# Patient Record
Sex: Male | Born: 1985 | Race: White | Hispanic: No | Marital: Single | Smoking: Never smoker
Health system: Southern US, Community
[De-identification: ages and names within clinical notes are randomized; demographics above are authoritative.]

## PROBLEM LIST (undated history)

## (undated) DIAGNOSIS — J4540 Moderate persistent asthma, uncomplicated: Secondary | ICD-10-CM

## (undated) DIAGNOSIS — G4700 Insomnia, unspecified: Secondary | ICD-10-CM

## (undated) DIAGNOSIS — J45909 Unspecified asthma, uncomplicated: Secondary | ICD-10-CM

## (undated) DIAGNOSIS — R030 Elevated blood-pressure reading, without diagnosis of hypertension: Secondary | ICD-10-CM

## (undated) DIAGNOSIS — M25519 Pain in unspecified shoulder: Secondary | ICD-10-CM

## (undated) HISTORY — DX: Elevated blood-pressure reading, without diagnosis of hypertension: R03.0

## (undated) HISTORY — DX: Pain in unspecified shoulder: M25.519

## (undated) HISTORY — DX: Moderate persistent asthma, uncomplicated: J45.40

## (undated) HISTORY — DX: Insomnia, unspecified: G47.00

## (undated) HISTORY — DX: Morbid (severe) obesity due to excess calories: E66.01

---

## 2006-03-13 ENCOUNTER — Emergency Department: Payer: Self-pay | Admitting: Unknown Physician Specialty

## 2006-03-26 ENCOUNTER — Emergency Department: Payer: Self-pay | Admitting: Emergency Medicine

## 2008-04-20 ENCOUNTER — Emergency Department: Payer: Self-pay | Admitting: Emergency Medicine

## 2010-04-12 ENCOUNTER — Emergency Department: Payer: Self-pay | Admitting: Emergency Medicine

## 2013-05-03 ENCOUNTER — Ambulatory Visit: Payer: Self-pay

## 2013-12-06 ENCOUNTER — Emergency Department: Payer: Self-pay | Admitting: Emergency Medicine

## 2015-02-13 DIAGNOSIS — G4700 Insomnia, unspecified: Secondary | ICD-10-CM

## 2015-02-13 DIAGNOSIS — M25519 Pain in unspecified shoulder: Secondary | ICD-10-CM

## 2015-02-13 DIAGNOSIS — J4540 Moderate persistent asthma, uncomplicated: Secondary | ICD-10-CM

## 2015-02-13 DIAGNOSIS — R030 Elevated blood-pressure reading, without diagnosis of hypertension: Secondary | ICD-10-CM

## 2015-02-13 HISTORY — DX: Elevated blood-pressure reading, without diagnosis of hypertension: R03.0

## 2015-02-13 HISTORY — DX: Insomnia, unspecified: G47.00

## 2015-02-13 HISTORY — DX: Moderate persistent asthma, uncomplicated: J45.40

## 2015-02-13 HISTORY — DX: Pain in unspecified shoulder: M25.519

## 2015-02-13 HISTORY — DX: Morbid (severe) obesity due to excess calories: E66.01

## 2016-01-08 ENCOUNTER — Encounter: Payer: Self-pay | Admitting: Emergency Medicine

## 2016-01-08 ENCOUNTER — Emergency Department
Admission: EM | Admit: 2016-01-08 | Discharge: 2016-01-08 | Disposition: A | Discharge status: Home / Self Care | Payer: BLUE CROSS/BLUE SHIELD | Attending: Emergency Medicine | Admitting: Emergency Medicine

## 2016-01-08 ENCOUNTER — Emergency Department: Payer: BLUE CROSS/BLUE SHIELD

## 2016-01-08 DIAGNOSIS — M25512 Pain in left shoulder: Secondary | ICD-10-CM | POA: Diagnosis present

## 2016-01-08 DIAGNOSIS — J45909 Unspecified asthma, uncomplicated: Secondary | ICD-10-CM | POA: Insufficient documentation

## 2016-01-08 HISTORY — DX: Unspecified asthma, uncomplicated: J45.909

## 2016-01-08 MED ORDER — METHOCARBAMOL 500 MG PO TABS: 500.0000 mg | ORAL_TABLET | Freq: Four times a day (QID) | ORAL | 0 refills | Status: AC

## 2016-01-08 MED ORDER — HYDROMORPHONE HCL 1 MG/ML IJ SOLN
1.0000 mg | Freq: Once | INTRAMUSCULAR | Status: AC
  Administered 2016-01-08: 1 mg via INTRAMUSCULAR
  Filled 2016-01-08: qty 1

## 2016-01-08 NOTE — ED Provider Notes (Signed)
[facility] Emergency Department Provider Note  ____________________________________________  Time seen: Approximately 8:54 AM  I have reviewed the triage vital signs and the nursing notes.   HISTORY  Chief Complaint Shoulder Pain    HPI Ronnie Chandler is a 30 y.o. male who complains of left shoulder pain since last night. 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  He's had corrective surgery on the right shoulder but not on the left shoulder so far. He says that he often dislocates it while he sleeps and this occurred just a few days ago but he is able to reduce it by himself at home. Last night, he was not asleep, but he was awake lying in bed watching TV when he felt sudden pain in the left shoulder and was concerned that it was dislocated again. Since then the pain is been continuous and worse with shoulder movement. No chest pain shortness of breath fevers or chills. No recent trauma .     Past Medical History:  Diagnosis Date  . Asthma      There are no active problems to display for this patient.    History reviewed. No pertinent surgical history. Right shoulder surgery  Prior to Admission medications   Medication Sig Start Date End Date Taking? Authorizing Provider  methocarbamol (ROBAXIN) 500 MG tablet Take 1 tablet (500 mg total) by mouth 4 (four) times daily. 01/08/16   Carrie Mew, MD     Allergies Patient has no known allergies.   No family history on file.  Social History Social History  Substance Use Topics  . Smoking status: Never Smoker  . Smokeless tobacco: Never Used  . Alcohol use Yes    Review of Systems  Constitutional:   No fever or chills.  ENT:   No sore throat. No rhinorrhea. Cardiovascular:   No chest pain. Respiratory:   No dyspnea or cough. Gastrointestinal:   Negative for abdominal pain, vomiting and  diarrhea.  Genitourinary:   Negative for dysuria or difficulty urinating. Musculoskeletal:   Left shoulder pain as above Neurological:   Negative for headaches 10-point ROS otherwise negative.  ____________________________________________   PHYSICAL EXAM:  VITAL SIGNS: ED Triage Vitals  Enc Vitals Group     BP 01/08/16 0716 (!) 122/106     Pulse Rate 01/08/16 0716 (!) 150     Resp 01/08/16 0716 (!) 22     Temp 01/08/16 0716 98.7 F (37.1 C)     Temp Source 01/08/16 0716 Oral     SpO2 01/08/16 0716 98 %     Weight 01/08/16 0717 272 lb (123.4 kg)     Height 01/08/16 0717 5\' 11"  (1.803 m)     Head Circumference --      Peak Flow --      Pain Score 01/08/16 0717 8     Pain Loc --      Pain Edu? --      Excl. in [place]? --     Vital signs reviewed, nursing assessments reviewed.   Constitutional:   Alert and oriented. Well appearing and in no distress. Eyes:   No scleral icterus. No conjunctival pallor. PERRL. EOMI.  No nystagmus. ENT   Head:   Normocephalic and atraumatic.   Nose:   No congestion/rhinnorhea. No septal hematoma   Mouth/Throat:   MMM, no pharyngeal erythema. No peritonsillar mass.    Neck:   No  stridor. No SubQ emphysema. No meningismus. Hematological/Lymphatic/Immunilogical:   No cervical lymphadenopathy. Cardiovascular:   RRR. Symmetric bilateral radial and DP pulses.  No murmurs.  Respiratory:   Normal respiratory effort without tachypnea nor retractions. Breath sounds are clear and equal bilaterally. No wheezes/rales/rhonchi. Gastrointestinal:   Soft and nontender. Non distended. There is no CVA tenderness.  No rebound, rigidity, or guarding. Genitourinary:   deferred Musculoskeletal:   Mild tenderness diffusely over the entire track of the deltoid muscle around the left shoulder. No bony point tenderness. No severe pain with passive range of motion, but patient is guarding the shoulder quite a bit and there appears to be some muscular spasm as  well.. Neurologic:   Normal speech and language.  CN 2-10 normal. Motor grossly intact. No gross focal neurologic deficits are appreciated.  Skin:    Skin is warm, dry and intact. No rash noted.  No petechiae, purpura, or bullae. No ecchymosis or crepitus or inflammatory changes, particularly in the area of the left shoulder.  ____________________________________________    LABS (pertinent positives/negatives) (all labs ordered are listed, but only abnormal results are displayed) Labs Reviewed - No data to display ____________________________________________   EKG    ____________________________________________    RADIOLOGY  X-ray left shoulder unremarkable  ____________________________________________   PROCEDURES Procedures  ____________________________________________   INITIAL IMPRESSION / ASSESSMENT AND PLAN / ED COURSE  Pertinent labs & imaging results that were available during my care of the patient were reviewed by me and considered in my medical decision making (see chart for details).  Patient well appearing in no acute distress, except since unremarkable except for left shoulder pain with guarding by the patient. No evidence of fracture dislocation osteomyelitis septic arthritis or soft tissue infection. According the patient this is a chronic issue. He has a shoulder immobilizer at home but he has not been using it because it is difficult to apply by himself. He has not yet followed up with orthopedics because he had applied for disability insurance and was waiting for decision on that so that it could hopefully help pay for his further medical care. I have encouraged him to follow up with orthopedics as soon as possible. We'll give him a sling for joint support for now. Methocarbamol for muscle relaxation.   Clinical Course    ____________________________________________   FINAL CLINICAL IMPRESSION(S) / ED DIAGNOSES  Final diagnoses:  Left shoulder  pain, unspecified chronicity      New Prescriptions   METHOCARBAMOL (ROBAXIN) 500 MG TABLET    Take 1 tablet (500 mg total) by mouth 4 (four) times daily.     Portions of this note were generated with dragon dictation software. Dictation errors may occur despite best attempts at proofreading.    Carrie Mew, MD 01/08/16 0900

## 2016-01-08 NOTE — ED Notes (Signed)
Patient transported to X-ray 

## 2016-01-08 NOTE — ED Triage Notes (Signed)
Pt reports left shoulder pain, possible dislocation during the night while lying in bed. Pt states dislocated last week but pt was able to put back in.

## 2016-01-08 NOTE — ED Notes (Addendum)
Pt does not want sling. He wants to use immobilizer at house.

## 2016-01-12 HISTORY — PX: SHOULDER ARTHROSCOPY: SHX128

## 2016-01-23 ENCOUNTER — Other Ambulatory Visit: Payer: Self-pay | Admitting: Student

## 2016-01-23 DIAGNOSIS — S43005S Unspecified dislocation of left shoulder joint, sequela: Secondary | ICD-10-CM

## 2016-01-23 DIAGNOSIS — M25312 Other instability, left shoulder: Secondary | ICD-10-CM

## 2016-01-30 ENCOUNTER — Other Ambulatory Visit: Payer: Self-pay | Admitting: Surgery

## 2016-01-30 DIAGNOSIS — S41009A Unspecified open wound of unspecified shoulder, initial encounter: Secondary | ICD-10-CM

## 2016-01-30 DIAGNOSIS — S43006A Unspecified dislocation of unspecified shoulder joint, initial encounter: Principal | ICD-10-CM

## 2016-02-05 ENCOUNTER — Inpatient Hospital Stay: Admission: RE | Admit: 2016-02-05 | Payer: BLUE CROSS/BLUE SHIELD | Source: Ambulatory Visit

## 2016-02-05 ENCOUNTER — Ambulatory Visit: Admission: RE | Admit: 2016-02-05 | Payer: BLUE CROSS/BLUE SHIELD | Source: Ambulatory Visit

## 2016-02-05 ENCOUNTER — Ambulatory Visit: Payer: BLUE CROSS/BLUE SHIELD

## 2016-02-05 ENCOUNTER — Other Ambulatory Visit: Payer: Self-pay | Admitting: Surgery

## 2016-02-05 DIAGNOSIS — M25312 Other instability, left shoulder: Secondary | ICD-10-CM

## 2016-02-05 DIAGNOSIS — S43006A Unspecified dislocation of unspecified shoulder joint, initial encounter: Principal | ICD-10-CM

## 2016-02-05 DIAGNOSIS — S41009A Unspecified open wound of unspecified shoulder, initial encounter: Secondary | ICD-10-CM

## 2016-02-11 ENCOUNTER — Ambulatory Visit
Admission: RE | Admit: 2016-02-11 | Discharge: 2016-02-11 | Disposition: A | Discharge status: Home / Self Care | Payer: BLUE CROSS/BLUE SHIELD | Source: Ambulatory Visit | Attending: Surgery | Admitting: Surgery

## 2016-02-11 DIAGNOSIS — S43006A Unspecified dislocation of unspecified shoulder joint, initial encounter: Principal | ICD-10-CM

## 2016-02-11 DIAGNOSIS — M25312 Other instability, left shoulder: Secondary | ICD-10-CM

## 2016-02-11 DIAGNOSIS — S41009A Unspecified open wound of unspecified shoulder, initial encounter: Secondary | ICD-10-CM

## 2016-02-11 MED ORDER — IOPAMIDOL (ISOVUE-M 200) INJECTION 41%
12.0000 mL | Freq: Once | INTRAMUSCULAR | Status: AC
  Administered 2016-02-11: 12 mL via INTRA_ARTICULAR

## 2017-08-16 ENCOUNTER — Ambulatory Visit: Payer: BLUE CROSS/BLUE SHIELD | Admitting: Surgery

## 2017-08-16 ENCOUNTER — Encounter: Payer: Self-pay | Admitting: Surgery

## 2017-08-16 VITALS — BP 123/79 | HR 83 | Temp 98.7°F | Ht 71.0 in | Wt 273.8 lb

## 2017-08-16 DIAGNOSIS — K429 Umbilical hernia without obstruction or gangrene: Secondary | ICD-10-CM
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
## 2017-08-16 NOTE — Patient Instructions (Signed)
You have requested for your Umbilical Hernia be repaired. This has been scheduled on 08/22/2017 by Dr. Dr. Tama High at [facility].  Please see your (blue)pre-care sheet for information.  You will need to arrange to be off work for 1-2 weeks but will have to have a lifting restriction of no more than 15 lbs for 6 weeks following your surgery.   Umbilical Hernia, Adult A hernia is a bulge of tissue that pushes through an opening between muscles. An umbilical hernia happens in the abdomen, near the belly button (umbilicus). The hernia may contain tissues from the small intestine, large intestine, or fatty tissue covering the intestines (omentum). Umbilical hernias in adults tend to get worse over time, and they require surgical treatment. There are several types of umbilical hernias. You may have:  A hernia located just above or below the umbilicus (indirect hernia). This is the most common type of umbilical hernia in adults.  A hernia that forms through an opening formed by the umbilicus (direct hernia).  A hernia that comes and goes (reducible hernia). A reducible hernia may be visible only when you strain, lift something heavy, or cough. This type of hernia can be pushed back into the abdomen (reduced).  A hernia that traps abdominal tissue inside the hernia (incarcerated hernia). This type of hernia cannot be reduced.  A hernia that cuts off blood flow to the tissues inside the hernia (strangulated hernia). The tissues can start to die if this happens. This type of hernia requires emergency treatment.  What are the causes? An umbilical hernia happens when tissue inside the abdomen presses on a weak area of the abdominal muscles. What increases the risk? You may have a greater risk of this condition if you:  Are obese.  Have had several pregnancies.  Have a buildup of fluid inside your abdomen (ascites).  Have had surgery that weakens the abdominal muscles.  What are the  signs or symptoms? The main symptom of this condition is a painless bulge at or near the belly button. A reducible hernia may be visible only when you strain, lift something heavy, or cough. Other symptoms may include:  Dull pain.  A feeling of pressure.  Symptoms of a strangulated hernia may include:  Pain that gets increasingly worse.  Nausea and vomiting.  Pain when pressing on the hernia.  Skin over the hernia becoming red or purple.  Constipation.  Blood in the stool.  How is this diagnosed? This condition may be diagnosed based on:  A physical exam. You may be asked to cough or strain while standing. These actions increase the pressure inside your abdomen and force the hernia through the opening in your muscles. Your health care provider may try to reduce the hernia by pressing on it.  Your symptoms and medical history.  How is this treated? Surgery is the only treatment for an umbilical hernia. Surgery for a strangulated hernia is done as soon as possible. If you have a small hernia that is not incarcerated, you may need to lose weight before having surgery. Follow these instructions at home:  Lose weight, if told by your health care provider.  Do not try to push the hernia back in.  Watch your hernia for any changes in color or size. Tell your health care provider if any changes occur.  You may need to avoid activities that increase pressure on your hernia.  Do not lift anything that is heavier than 10 lb (4.5 kg) until your  health care provider says that this is safe.  Take over-the-counter and prescription medicines only as told by your health care provider.  Keep all follow-up visits as told by your health care provider. This is important. Contact a health care provider if:  Your hernia gets larger.  Your hernia becomes painful. Get help right away if:  You develop sudden, severe pain near the area of your hernia.  You have pain as well as nausea or  vomiting.  You have pain and the skin over your hernia changes color.  You develop a fever. This information is not intended to replace advice given to you by your health care provider. Make sure you discuss any questions you have with your health care provider. Document Released: 05/30/2015 Document Revised: 08/31/2015 Document Reviewed: 05/30/2015 Elsevier Interactive Patient Education  [employer].

## 2017-08-18 ENCOUNTER — Telehealth: Payer: Self-pay | Admitting: Surgery

## 2017-08-18 NOTE — Telephone Encounter (Signed)
Pt advised of pre op date/time and sx date. Sx: 08/22/17 with Dr Marcy Siren umbilical hernia repair with mesh.  Pre op: Patient advised to arrive at 8:00am the day of surgery-[facility] registration desk. Also NPO after midnight.

## 2017-08-21 MED ORDER — DEXTROSE 5 % IV SOLN
3.0000 g | INTRAVENOUS | Status: AC
  Administered 2017-08-22: 3 g via INTRAVENOUS
  Filled 2017-08-21: qty 3

## 2017-08-22 ENCOUNTER — Ambulatory Visit: Payer: BLUE CROSS/BLUE SHIELD | Admitting: Certified Registered"

## 2017-08-22 ENCOUNTER — Encounter: Payer: Self-pay | Admitting: *Deleted

## 2017-08-22 ENCOUNTER — Ambulatory Visit
Admission: RE | Admit: 2017-08-22 | Discharge: 2017-08-22 | Disposition: A | Discharge status: Home / Self Care | Payer: BLUE CROSS/BLUE SHIELD | Source: Ambulatory Visit | Attending: Surgery | Admitting: Surgery

## 2017-08-22 ENCOUNTER — Encounter
Admission: RE | Disposition: A | Discharge status: Home / Self Care | Payer: Self-pay | Source: Ambulatory Visit | Attending: Surgery

## 2017-08-22 DIAGNOSIS — G8929 Other chronic pain: Secondary | ICD-10-CM | POA: Diagnosis not present

## 2017-08-22 DIAGNOSIS — Z79899 Other long term (current) drug therapy: Secondary | ICD-10-CM | POA: Diagnosis not present

## 2017-08-22 DIAGNOSIS — I10 Essential (primary) hypertension: Secondary | ICD-10-CM | POA: Diagnosis not present

## 2017-08-22 DIAGNOSIS — Z6838 Body mass index (BMI) 38.0-38.9, adult: Secondary | ICD-10-CM | POA: Diagnosis not present

## 2017-08-22 DIAGNOSIS — M545 Low back pain: Secondary | ICD-10-CM | POA: Diagnosis not present

## 2017-08-22 DIAGNOSIS — G4700 Insomnia, unspecified: Secondary | ICD-10-CM | POA: Diagnosis not present

## 2017-08-22 DIAGNOSIS — Z791 Long term (current) use of non-steroidal anti-inflammatories (NSAID): Secondary | ICD-10-CM | POA: Insufficient documentation

## 2017-08-22 DIAGNOSIS — J4540 Moderate persistent asthma, uncomplicated: Secondary | ICD-10-CM | POA: Insufficient documentation

## 2017-08-22 DIAGNOSIS — K429 Umbilical hernia without obstruction or gangrene: Secondary | ICD-10-CM | POA: Diagnosis present

## 2017-08-22 DIAGNOSIS — M25519 Pain in unspecified shoulder: Secondary | ICD-10-CM | POA: Insufficient documentation

## 2017-08-22 HISTORY — PX: UMBILICAL HERNIA REPAIR: SHX196

## 2017-08-22 LAB — URINE DRUG SCREEN, QUALITATIVE (ARMC ONLY)
Amphetamines, Ur Screen: NOT DETECTED
BARBITURATES, UR SCREEN: NOT DETECTED
CANNABINOID 50 NG, UR ~~LOC~~: POSITIVE — AB
Cocaine Metabolite,Ur ~~LOC~~: NOT DETECTED
MDMA (ECSTASY) UR SCREEN: NOT DETECTED
METHADONE SCREEN, URINE: NOT DETECTED
Opiate, Ur Screen: NOT DETECTED
Phencyclidine (PCP) Ur S: NOT DETECTED
TRICYCLIC, UR SCREEN: NOT DETECTED

## 2017-08-22 LAB — BASIC METABOLIC PANEL
Anion gap: 10 (ref 5–15)
BUN: 8 mg/dL (ref 6–20)
CALCIUM: 9.1 mg/dL (ref 8.9–10.3)
CO2: 25 mmol/L (ref 22–32)
CREATININE: 0.87 mg/dL (ref 0.61–1.24)
Chloride: 105 mmol/L (ref 98–111)
GFR calc Af Amer: >60 mL/min (ref >60)
Glucose, Bld: 118 mg/dL — ABNORMAL HIGH (ref 70–99)
Potassium: 3.8 mmol/L (ref 3.5–5.1)
SODIUM: 140 mmol/L (ref 135–145)

## 2017-08-22 LAB — CBC WITH DIFFERENTIAL/PLATELET
BASOS ABS: 0.1 10*3/uL (ref 0–0.1)
BASOS PCT: 1 %
EOS ABS: 0.6 10*3/uL (ref 0–0.7)
EOS PCT: 9 %
HCT: 42.4 % (ref 40.0–52.0)
HEMOGLOBIN: 14.5 g/dL (ref 13.0–18.0)
LYMPHS ABS: 1.5 10*3/uL (ref 1.0–3.6)
Lymphocytes Relative: 23 %
MCH: 32.3 pg (ref 26.0–34.0)
MCHC: 34.2 g/dL (ref 32.0–36.0)
MCV: 94.4 fL (ref 80.0–100.0)
Monocytes Absolute: 0.5 10*3/uL (ref 0.2–1.0)
Monocytes Relative: 8 %
Neutro Abs: 4 10*3/uL (ref 1.4–6.5)
Neutrophils Relative %: 59 %
PLATELETS: 388 10*3/uL (ref 150–440)
RBC: 4.49 MIL/uL (ref 4.40–5.90)
RDW: 13.5 % (ref 11.5–14.5)
WBC: 6.7 10*3/uL (ref 3.8–10.6)

## 2017-08-22 SURGERY — REPAIR, HERNIA, UMBILICAL, ADULT
Anesthesia: General

## 2017-08-22 MED ORDER — BUPIVACAINE HCL (PF) 0.5 % IJ SOLN
INTRAMUSCULAR | Status: AC
  Filled 2017-08-22: qty 30

## 2017-08-22 MED ORDER — IPRATROPIUM-ALBUTEROL 0.5-2.5 (3) MG/3ML IN SOLN
3.0000 mL | Freq: Once | RESPIRATORY_TRACT | Status: AC
  Administered 2017-08-22: 3 mL via RESPIRATORY_TRACT

## 2017-08-22 MED ORDER — DEXAMETHASONE SODIUM PHOSPHATE 10 MG/ML IJ SOLN
INTRAMUSCULAR | Status: DC | PRN
  Administered 2017-08-22: 8 mg via INTRAVENOUS

## 2017-08-22 MED ORDER — ACETAMINOPHEN 500 MG PO TABS
1000.0000 mg | ORAL_TABLET | ORAL | Status: AC
  Administered 2017-08-22: 1000 mg via ORAL

## 2017-08-22 MED ORDER — FENTANYL CITRATE (PF) 100 MCG/2ML IJ SOLN
INTRAMUSCULAR | Status: AC
  Administered 2017-08-22: 25 ug via INTRAVENOUS
  Filled 2017-08-22: qty 2

## 2017-08-22 MED ORDER — PROPOFOL 10 MG/ML IV BOLUS
INTRAVENOUS | Status: AC
  Filled 2017-08-22: qty 20

## 2017-08-22 MED ORDER — MIDAZOLAM HCL 2 MG/2ML IJ SOLN
INTRAMUSCULAR | Status: DC | PRN
  Administered 2017-08-22: 2 mg via INTRAVENOUS

## 2017-08-22 MED ORDER — ALBUTEROL SULFATE HFA 108 (90 BASE) MCG/ACT IN AERS
INHALATION_SPRAY | RESPIRATORY_TRACT | Status: AC
  Filled 2017-08-22: qty 6.7

## 2017-08-22 MED ORDER — PROPOFOL 10 MG/ML IV BOLUS
INTRAVENOUS | Status: DC | PRN
  Administered 2017-08-22: 250 mg via INTRAVENOUS
  Administered 2017-08-22 (×2): 50 mg via INTRAVENOUS

## 2017-08-22 MED ORDER — OXYCODONE HCL 5 MG PO TABS
ORAL_TABLET | ORAL | Status: AC
  Administered 2017-08-22: 5 mg via ORAL
  Filled 2017-08-22: qty 1

## 2017-08-22 MED ORDER — LIDOCAINE HCL 1 % IJ SOLN
INTRAMUSCULAR | Status: DC | PRN
  Administered 2017-08-22: 20 mL

## 2017-08-22 MED ORDER — LACTATED RINGERS IV SOLN
INTRAVENOUS | Status: DC
  Administered 2017-08-22: 09:00:00 via INTRAVENOUS

## 2017-08-22 MED ORDER — SUCCINYLCHOLINE CHLORIDE 20 MG/ML IJ SOLN
INTRAMUSCULAR | Status: DC | PRN
  Administered 2017-08-22: 120 mg via INTRAVENOUS

## 2017-08-22 MED ORDER — FENTANYL CITRATE (PF) 100 MCG/2ML IJ SOLN
25.0000 ug | INTRAMUSCULAR | Status: DC | PRN
  Administered 2017-08-22: 25 ug via INTRAVENOUS
  Administered 2017-08-22 (×2): 50 ug via INTRAVENOUS
  Administered 2017-08-22: 25 ug via INTRAVENOUS

## 2017-08-22 MED ORDER — GABAPENTIN 300 MG PO CAPS
300.0000 mg | ORAL_CAPSULE | ORAL | Status: AC
  Administered 2017-08-22: 300 mg via ORAL

## 2017-08-22 MED ORDER — GABAPENTIN 300 MG PO CAPS
ORAL_CAPSULE | ORAL | Status: AC
  Administered 2017-08-22: 300 mg via ORAL
  Filled 2017-08-22: qty 1

## 2017-08-22 MED ORDER — CHLORHEXIDINE GLUCONATE CLOTH 2 % EX PADS: 6.0000 | MEDICATED_PAD | Freq: Once | CUTANEOUS | Status: DC

## 2017-08-22 MED ORDER — LIDOCAINE HCL (PF) 1 % IJ SOLN
INTRAMUSCULAR | Status: AC
  Filled 2017-08-22: qty 30

## 2017-08-22 MED ORDER — DEXAMETHASONE SODIUM PHOSPHATE 10 MG/ML IJ SOLN
INTRAMUSCULAR | Status: AC
  Filled 2017-08-22: qty 1

## 2017-08-22 MED ORDER — LIDOCAINE HCL (CARDIAC) PF 100 MG/5ML IV SOSY
PREFILLED_SYRINGE | INTRAVENOUS | Status: DC | PRN
Start: 2017-08-22 — End: 2017-08-22
  Administered 2017-08-22: 100 mg via INTRAVENOUS

## 2017-08-22 MED ORDER — MIDAZOLAM HCL 2 MG/2ML IJ SOLN
INTRAMUSCULAR | Status: AC
  Filled 2017-08-22: qty 2

## 2017-08-22 MED ORDER — SEVOFLURANE IN SOLN
RESPIRATORY_TRACT | Status: AC
  Filled 2017-08-22: qty 250

## 2017-08-22 MED ORDER — LIDOCAINE HCL (PF) 2 % IJ SOLN
INTRAMUSCULAR | Status: AC
  Filled 2017-08-22: qty 10

## 2017-08-22 MED ORDER — ACETAMINOPHEN 500 MG PO TABS
ORAL_TABLET | ORAL | Status: AC
  Administered 2017-08-22: 1000 mg via ORAL
  Filled 2017-08-22: qty 2

## 2017-08-22 MED ORDER — OXYCODONE HCL 5 MG/5ML PO SOLN: 5.0000 mg | Freq: Once | ORAL | Status: AC | PRN

## 2017-08-22 MED ORDER — SUCCINYLCHOLINE CHLORIDE 20 MG/ML IJ SOLN
INTRAMUSCULAR | Status: AC
Start: 2017-08-22 — End: ?
  Filled 2017-08-22: qty 1

## 2017-08-22 MED ORDER — FENTANYL CITRATE (PF) 100 MCG/2ML IJ SOLN
INTRAMUSCULAR | Status: AC
  Administered 2017-08-22: 50 ug via INTRAVENOUS
  Filled 2017-08-22: qty 2

## 2017-08-22 MED ORDER — OXYCODONE-ACETAMINOPHEN 5-325 MG PO TABS: 1.0000 | ORAL_TABLET | ORAL | 0 refills | Status: DC | PRN

## 2017-08-22 MED ORDER — ROCURONIUM BROMIDE 50 MG/5ML IV SOLN
INTRAVENOUS | Status: AC
  Filled 2017-08-22: qty 1

## 2017-08-22 MED ORDER — ONDANSETRON HCL 4 MG/2ML IJ SOLN
INTRAMUSCULAR | Status: DC | PRN
  Administered 2017-08-22: 4 mg via INTRAVENOUS

## 2017-08-22 MED ORDER — CHLORHEXIDINE GLUCONATE CLOTH 2 % EX PADS
6.0000 | MEDICATED_PAD | Freq: Once | CUTANEOUS | Status: AC
  Administered 2017-08-22: 6 via TOPICAL

## 2017-08-22 MED ORDER — ONDANSETRON HCL 4 MG/2ML IJ SOLN
INTRAMUSCULAR | Status: AC
  Filled 2017-08-22: qty 2

## 2017-08-22 MED ORDER — IPRATROPIUM-ALBUTEROL 0.5-2.5 (3) MG/3ML IN SOLN
RESPIRATORY_TRACT | Status: AC
  Administered 2017-08-22: 3 mL via RESPIRATORY_TRACT
  Filled 2017-08-22: qty 3

## 2017-08-22 MED ORDER — FENTANYL CITRATE (PF) 250 MCG/5ML IJ SOLN
INTRAMUSCULAR | Status: AC
Start: 2017-08-22 — End: ?
  Filled 2017-08-22: qty 5

## 2017-08-22 MED ORDER — FENTANYL CITRATE (PF) 100 MCG/2ML IJ SOLN
INTRAMUSCULAR | Status: DC | PRN
  Administered 2017-08-22 (×5): 50 ug via INTRAVENOUS

## 2017-08-22 MED ORDER — PROMETHAZINE HCL 25 MG/ML IJ SOLN: 6.2500 mg | INTRAMUSCULAR | Status: DC | PRN

## 2017-08-22 MED ORDER — MEPERIDINE HCL 50 MG/ML IJ SOLN: 6.2500 mg | INTRAMUSCULAR | Status: DC | PRN

## 2017-08-22 MED ORDER — ALBUTEROL SULFATE HFA 108 (90 BASE) MCG/ACT IN AERS
INHALATION_SPRAY | RESPIRATORY_TRACT | Status: DC | PRN
  Administered 2017-08-22: 4 via RESPIRATORY_TRACT

## 2017-08-22 MED ORDER — OXYCODONE HCL 5 MG PO TABS
5.0000 mg | ORAL_TABLET | Freq: Once | ORAL | Status: AC | PRN
  Administered 2017-08-22: 5 mg via ORAL

## 2017-08-22 SURGICAL SUPPLY — 25 items
CHLORAPREP W/TINT 26ML (MISCELLANEOUS) ×3 IMPLANT
DECANTER SPIKE VIAL GLASS SM (MISCELLANEOUS) ×6 IMPLANT
DERMABOND ADVANCED (GAUZE/BANDAGES/DRESSINGS) ×2
DERMABOND ADVANCED .7 DNX12 (GAUZE/BANDAGES/DRESSINGS) ×1 IMPLANT
DRAPE LAPAROTOMY 77X122 PED (DRAPES) ×3 IMPLANT
ELECT REM PT RETURN 9FT ADLT (ELECTROSURGICAL) ×3
ELECTRODE REM PT RTRN 9FT ADLT (ELECTROSURGICAL) ×1 IMPLANT
GLOVE BIO SURGEON STRL SZ7 (GLOVE) ×3 IMPLANT
GLOVE BIOGEL PI IND STRL 7.5 (GLOVE) ×1 IMPLANT
GLOVE BIOGEL PI INDICATOR 7.5 (GLOVE) ×2
GOWN STRL REUS W/ TWL LRG LVL3 (GOWN DISPOSABLE) ×1 IMPLANT
GOWN STRL REUS W/TWL LRG LVL3 (GOWN DISPOSABLE) ×2
INSERT FOAM CUSHION (MISCELLANEOUS) IMPLANT
KIT TURNOVER KIT A (KITS) ×3 IMPLANT
MESH VENTRALEX ST 1-7/10 CRC S (Mesh General) ×3 IMPLANT
NEEDLE HYPO 22GX1.5 SAFETY (NEEDLE) ×3 IMPLANT
NS IRRIG 1000ML POUR BTL (IV SOLUTION) ×3 IMPLANT
PACK BASIN MINOR ARMC (MISCELLANEOUS) ×3 IMPLANT
SUT ETHIBOND 0 MO6 C/R (SUTURE) ×3 IMPLANT
SUT MNCRL AB 4-0 PS2 18 (SUTURE) ×3 IMPLANT
SUT VIC AB 2-0 SH 27 (SUTURE) ×2
SUT VIC AB 2-0 SH 27XBRD (SUTURE) ×1 IMPLANT
SUT VIC AB 3-0 SH 27 (SUTURE) ×2
SUT VIC AB 3-0 SH 27X BRD (SUTURE) ×1 IMPLANT
SYR 10ML LL (SYRINGE) ×3 IMPLANT

## 2017-08-22 NOTE — Anesthesia Procedure Notes (Signed)
Procedure Name: LMA Insertion Performed by: Ivana Nicastro, CRNA Pre-anesthesia Checklist: Patient identified, Patient being monitored, Timeout performed, Emergency Drugs available and Suction available Patient Re-evaluated:Patient Re-evaluated prior to induction Oxygen Delivery Method: Circle system utilized Preoxygenation: Pre-oxygenation with 100% oxygen Induction Type: IV induction LMA: LMA inserted LMA Size: 4.5 Tube type: Oral Number of attempts: 1 Placement Confirmation: positive ETCO2 and breath sounds checked- equal and bilateral Tube secured with: Tape Dental Injury: Teeth and Oropharynx as per pre-operative assessment        

## 2017-08-22 NOTE — Interval H&P Note (Signed)
History and Physical Interval Note:  08/22/2017 9:43 AM  Ronnie Chandler  has presented today for surgery, with the diagnosis of UMBILICAL HERNIA  The various methods of treatment have been discussed with the patient and family. After consideration of risks, benefits and other options for treatment, the patient has consented to  Procedure(s): HERNIA REPAIR UMBILICAL ADULT (N/A) as a surgical intervention .  The patient's history has been reviewed, patient examined, no change in status, stable for surgery.  I have reviewed the patient's chart and labs.  Questions were answered to the patient's satisfaction.     Vickie Epley

## 2017-08-22 NOTE — Anesthesia Postprocedure Evaluation (Signed)
Anesthesia Post Note  Patient: Ronnie Chandler  Procedure(s) Performed: HERNIA REPAIR UMBILICAL ADULT (N/A )  Patient location during evaluation: PACU Anesthesia Type: General Level of consciousness: awake and alert and oriented Pain management: pain level controlled Vital Signs Assessment: post-procedure vital signs reviewed and stable Respiratory status: spontaneous breathing, nonlabored ventilation and respiratory function stable Cardiovascular status: blood pressure returned to baseline and stable Postop Assessment: no signs of nausea or vomiting Anesthetic complications: no     Last Vitals:  Vitals:   08/22/17 1257 08/22/17 1304  BP: 135/80   Pulse: 97 (!) 102  Resp: 12 16  Temp:    SpO2: 97% 95%    Last Pain:  Vitals:   08/22/17 1257  TempSrc:   PainSc: 4                  Abdinasir Spadafore

## 2017-08-22 NOTE — Discharge Instructions (Addendum)
In addition to included general post-operative instructions for Open Repair of Umbilical Hernia with mesh,  Diet: Resume home heart healthy diet.   Activity: No heavy lifting >15 - 20 pounds (children, pets, laundry, garbage) or strenuous activity until follow-up, but light activity and walking are encouraged. Do not drive or drink alcohol if taking narcotic pain medications.  Wound care: 2 days after surgery (Wednesday, 8/14), you may shower/get incision wet with soapy water and pat dry (do not rub incisions), but no baths or submerging incision underwater until follow-up.   Medications: Resume all home medications. For mild to moderate pain: acetaminophen (Tylenol) or ibuprofen/naproxen (if no kidney disease). Combining Tylenol with alcohol can substantially increase your risk of causing liver disease. Narcotic pain medications, if prescribed, can be used for severe pain, though may cause nausea, constipation, and drowsiness. Do not combine Tylenol and Percocet (or similar) within a 6 hour period as Percocet (and similar) contain(s) Tylenol. If you do not need the narcotic pain medication, you do not need to fill the prescription.  Call office [phone]) at any time if any questions, worsening pain, fevers/chills, bleeding, drainage from incision site, or other concerns.    AMBULATORY SURGERY  DISCHARGE INSTRUCTIONS   1) The drugs that you were given will stay in your system until tomorrow so for the next 24 hours you should not:  A) Drive an automobile B) Make any legal decisions C) Drink any alcoholic beverage   2) You may resume regular meals tomorrow.  Today it is better to start with liquids and gradually work up to solid foods.  You may eat anything you prefer, but it is better to start with liquids, then soup and crackers, and gradually work up to solid foods.   3) Please notify your doctor immediately if you have any unusual bleeding, trouble breathing, redness and pain at  the surgery site, drainage, fever, or pain not relieved by medication.    4) Additional Instructions:        Please contact your physician with any problems or Same Day Surgery at [phone], Monday through Friday 6 am to 4 pm, or [place] [facility] number at [phone].

## 2017-08-22 NOTE — Transfer of Care (Signed)
Immediate Anesthesia Transfer of Care Note  Patient: Ronnie Chandler  Procedure(s) Performed: HERNIA REPAIR UMBILICAL ADULT (N/A )  Patient Location: PACU  Anesthesia Type:General  Level of Consciousness: sedated  Airway & Oxygen Therapy: Patient Spontanous Breathing and Patient connected to face mask oxygen  Post-op Assessment: Report given to RN and Post -op Vital signs reviewed and stable  Post vital signs: Reviewed and stable  Last Vitals:  Vitals Value Taken Time  BP 134/68 08/22/2017 11:59 AM  Temp 36.9 C 08/22/2017 11:57 AM  Pulse 83 08/22/2017 11:59 AM  Resp 17 08/22/2017 11:59 AM  SpO2 93 % 08/22/2017 11:59 AM  Vitals shown include unvalidated device data.  Last Pain:  Vitals:   08/22/17 0826  TempSrc: Temporal  PainSc: 2          Complications: No apparent anesthesia complications

## 2017-08-22 NOTE — Op Note (Signed)
SURGICAL OPERATIVE REPORT  DATE OF PROCEDURE: 08/22/2017  ATTENDING Surgeon(s): Vickie Epley, MD  ANESTHESIA: GETA  PRE-OPERATIVE DIAGNOSIS: Increasingly symptomatic (painful) reducible umbilical hernia (TOI-71: K42.9)  POST-OPERATIVE DIAGNOSIS: Increasingly symptomatic (painful) incarcerated umbilical hernia (IWP-80: K42.9)  PROCEDURE(S):  1.) Open repair of symptomatic (painful) supra-umbilical hernia with mesh (cpt: 99833)  INTRAOPERATIVE FINDINGS: 1.5 cm umbilical hernia with adherent omentum and hernia sac  INTRAVENOUS FLUIDS: 400 mL crystalloid   ESTIMATED BLOOD LOSS: Minimal (<20 mL)   URINE OUTPUT: No Foley catheter  SPECIMENS: None  IMPLANTS: 4.3 cm Bard Ventralex ST ventral hernia repair mesh  DRAINS: None  COMPLICATIONS: None apparent  CONDITION AT END OF PROCEDURE: Hemodynamically stable and extubated  DISPOSITION OF PATIENT: PACU  INDICATIONS FOR PROCEDURE:  Patient is an obese 32 y.o. male who presented for evaluation of patient's increasingly painful umbilical hernia. He denies abdominal distention, change in bowel function, or N/V and likewise denies straining with urination or coughing, though he describes episodic dehydration-associated constipation. All risks, benefits, and alternatives to above elective procedure were discussed with the patient, all of patient's questions were answered to his expressed satisfaction, patient expressed he would like to proceed with repair of his umbilical hernia, and informed consent was obtained and documented accordingly.  DETAILS OF PROCEDURE: Patient was brought to the operating suite and appropriately identified. General anesthesia was administered along with appropriate pre-operative antibiotics, and endotracheal intubation was performed by anesthetist. In supine position, operative site was prepped and draped in the usual sterile fashion, and following a brief time out, local anesthetic was injected superior to  the umbilicus, and a 3 cm transverse curvilinear incision was made using a #15 blade scalpel and extended deep through subcutaneous tissues using blunt dissection and electrocautery. The hernia sac was then dissected from the undersurface of the umbilical skin and stalk, and the fascial defect was cleared of all omental adhesions. A 4.3 cm Bard Ventralex ST mesh patch was selected, inserted, confirmed to approximate well to fascial edges without intervening viscera or omentum, and patch was secured without tension using Ethibond braided non-absorbable suture. Fascia was then re-approximated over the mesh using an Ethibond braided non-absorbable suture in figure-of-eight fashion. The umbilicus was then invaginated using 2-0 Vicryl suture from the dermis to fascia, and the wound was irrigated using sterile saline. Overlying tissues were re-approximated in layers using buried interrupted 3-0 Vicryl suture for dermis, and sterile Dermabond skin glue was applied and allowed to dry.  Patient was then safely able to be extubated, awakened, and transferred to PACU for post-operative monitoring and care.  I was present for all aspects of the above procedure, and no operative complications were apparent.

## 2017-08-22 NOTE — Anesthesia Procedure Notes (Signed)
Procedure Name: Intubation Performed by: Lance Muss, CRNA Pre-anesthesia Checklist: Patient identified, Patient being monitored, Timeout performed, Emergency Drugs available and Suction available Patient Re-evaluated:Patient Re-evaluated prior to induction Oxygen Delivery Method: Circle system utilized Preoxygenation: Pre-oxygenation with 100% oxygen Induction Type: IV induction Ventilation: Mask ventilation without difficulty and Oral airway inserted - appropriate to patient size Laryngoscope Size: McGraph and 4 Grade View: Grade I Tube type: Oral Tube size: 7.5 mm Number of attempts: 2 Airway Equipment and Method: Stylet Placement Confirmation: ETT inserted through vocal cords under direct vision,  positive ETCO2 and breath sounds checked- equal and bilateral Secured at: 21 cm Tube secured with: Tape Dental Injury: Teeth and Oropharynx as per pre-operative assessment  Difficulty Due To: Difficult Airway- due to anterior larynx Future Recommendations: Recommend- induction with short-acting agent, and alternative techniques readily available Comments: First attempt with MAC 3 blade. Unable to visualize cords. Second DL with Mcgraph 4 with grade 1 view. +BBS, +EtCO2.

## 2017-08-22 NOTE — Anesthesia Post-op Follow-up Note (Signed)
Anesthesia QCDR form completed.        
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
## 2017-09-06 ENCOUNTER — Ambulatory Visit (INDEPENDENT_AMBULATORY_CARE_PROVIDER_SITE_OTHER): Payer: BLUE CROSS/BLUE SHIELD | Admitting: Surgery

## 2017-09-06 ENCOUNTER — Encounter: Payer: Self-pay | Admitting: Surgery

## 2017-09-06 VITALS — BP 115/76 | HR 84 | Temp 97.7°F | Ht 71.0 in | Wt 278.8 lb

## 2017-09-06 DIAGNOSIS — Z4889 Encounter for other specified surgical aftercare: Secondary | ICD-10-CM

## 2017-09-06 NOTE — Progress Notes (Signed)
Surgical Clinic Progress/Follow-up Note   HPI:  32 y.o. Male presents to clinic for post-op follow-up 2 weeks s/p open repair of supraumbilical hernia with mesh Rosana Hoes, 08/22/2017). Patient reports complete resolution of pre-operative bulge with minimal peri-incisional pain since last week. He has otherwise been tolerating regular diet with +flatus and normal BM's, denies N/V, fever/chills, CP, or SOB. 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  Review of Systems:  Constitutional: denies fever/chills  Respiratory: denies shortness of breath, wheezing  Cardiovascular: denies chest pain, palpitations  Gastrointestinal: abdominal pain, N/V, and bowel function as per interval history Skin: Denies any other rashes or skin discolorations except post-surgical wounds as per interval history  Vital Signs:  Ht 5\' 11"  (1.803 m)   Wt 278 lb 12.8 oz (126.5 kg)   BMI 38.88 kg/m   Physical Exam:  Constitutional:  -- Obese body habitus  -- Awake, alert, and oriented x3  Pulmonary:  -- No crackles -- Equal breath sounds bilaterally -- Breathing non-labored at rest Cardiovascular:  -- S1, S2 present  -- No pericardial rubs  Gastrointestinal:  -- Soft and non-distended, non-tender to palpation, no guarding/rebound tenderness -- Post-surgical incisions all well-approximated without any peri-incisional erythema or drainage -- No abdominal masses appreciated, pulsatile or otherwise  Musculoskeletal / Integumentary:  -- Wounds or skin discoloration: None appreciated except post-surgical incisions as described above (GI) -- Extremities: B/L UE and LE FROM, hands and feet warm, no edema   Assessment:  32 y.o. yo Male with a problem list including...  Patient Active Problem List   Diagnosis Date Noted  . Insomnia 02/13/2015  . Moderate persistent asthma without complication 90/38/3338  . Morbid obesity due to excess calories ([place]) 02/13/2015  . Pain in joint, shoulder region 02/13/2015  . Pre-hypertension 02/13/2015    presents to clinic for post-op follow-up evaluation, doing well 2 weeks s/p open repair of supra-umbilical hernia with mesh Rosana Hoes, 08/22/2017).  Plan:              - advance diet as tolerated              - okay to submerge incisions under water (baths, swimming) prn             - no heavy lifting >40 lbs x 4 more weeks, after which may gradually resume all activities without restrictions             - apply sunblock particularly to incisions with sun exposure to reduce pigmentation of scars  - weight loss encouraged for overall health and to decrease risk of hernia recurrence             - return to clinic as needed, instructed to call office if any questions or concerns  All of the above recommendations were discussed with the patient, and all of patient's questions were answered to his expressed satisfaction.  -- Marilynne Drivers Rosana Hoes, MD, [place]: [facility] - Partnering for exceptional care. Office: [phone]

## 2017-09-06 NOTE — Patient Instructions (Signed)

## 2017-09-08 DIAGNOSIS — K429 Umbilical hernia without obstruction or gangrene: Secondary | ICD-10-CM

## 2018-07-27 ENCOUNTER — Other Ambulatory Visit: Payer: Self-pay

## 2018-07-27 ENCOUNTER — Other Ambulatory Visit: Payer: Self-pay | Admitting: Student

## 2018-07-27 ENCOUNTER — Ambulatory Visit
Admission: RE | Admit: 2018-07-27 | Discharge: 2018-07-27 | Disposition: A | Discharge status: Home / Self Care | Payer: BLUE CROSS/BLUE SHIELD | Source: Ambulatory Visit | Attending: Student | Admitting: Student

## 2018-07-27 DIAGNOSIS — R1084 Generalized abdominal pain: Secondary | ICD-10-CM | POA: Diagnosis present

## 2018-07-27 DIAGNOSIS — R197 Diarrhea, unspecified: Secondary | ICD-10-CM | POA: Insufficient documentation

## 2018-07-27 DIAGNOSIS — R112 Nausea with vomiting, unspecified: Secondary | ICD-10-CM

## 2018-07-27 MED ORDER — IOHEXOL 300 MG/ML  SOLN
100.0000 mL | Freq: Once | INTRAMUSCULAR | Status: AC | PRN
  Administered 2018-07-27: 12:00:00 100 mL via INTRAVENOUS

## 2018-07-28 ENCOUNTER — Ambulatory Visit (INDEPENDENT_AMBULATORY_CARE_PROVIDER_SITE_OTHER): Payer: BLUE CROSS/BLUE SHIELD | Admitting: Internal Medicine

## 2018-07-28 ENCOUNTER — Other Ambulatory Visit
Admission: RE | Admit: 2018-07-28 | Discharge: 2018-07-28 | Disposition: A | Discharge status: Home / Self Care | Payer: BLUE CROSS/BLUE SHIELD | Source: Ambulatory Visit | Attending: Student | Admitting: Student

## 2018-07-28 DIAGNOSIS — J309 Allergic rhinitis, unspecified: Secondary | ICD-10-CM

## 2018-07-28 DIAGNOSIS — G4700 Insomnia, unspecified: Secondary | ICD-10-CM | POA: Diagnosis not present

## 2018-07-28 DIAGNOSIS — R109 Unspecified abdominal pain: Secondary | ICD-10-CM | POA: Diagnosis not present

## 2018-07-28 DIAGNOSIS — R748 Abnormal levels of other serum enzymes: Secondary | ICD-10-CM

## 2018-07-28 DIAGNOSIS — R739 Hyperglycemia, unspecified: Secondary | ICD-10-CM

## 2018-07-28 DIAGNOSIS — R112 Nausea with vomiting, unspecified: Secondary | ICD-10-CM | POA: Insufficient documentation

## 2018-07-28 DIAGNOSIS — K219 Gastro-esophageal reflux disease without esophagitis: Secondary | ICD-10-CM

## 2018-07-28 DIAGNOSIS — Z13220 Encounter for screening for lipoid disorders: Secondary | ICD-10-CM

## 2018-07-28 DIAGNOSIS — R1084 Generalized abdominal pain: Secondary | ICD-10-CM | POA: Diagnosis present

## 2018-07-28 DIAGNOSIS — E559 Vitamin D deficiency, unspecified: Secondary | ICD-10-CM

## 2018-07-28 DIAGNOSIS — R892 Abnormal level of other drugs, medicaments and biological substances in specimens from other organs, systems and tissues: Secondary | ICD-10-CM

## 2018-07-28 DIAGNOSIS — E669 Obesity, unspecified: Secondary | ICD-10-CM

## 2018-07-28 DIAGNOSIS — J4540 Moderate persistent asthma, uncomplicated: Secondary | ICD-10-CM

## 2018-07-28 DIAGNOSIS — K529 Noninfective gastroenteritis and colitis, unspecified: Secondary | ICD-10-CM | POA: Insufficient documentation

## 2018-07-28 LAB — GASTROINTESTINAL PANEL BY PCR, STOOL (REPLACES STOOL CULTURE)

## 2018-07-28 LAB — C DIFFICILE QUICK SCREEN W PCR REFLEX
C Diff antigen: NEGATIVE
C Diff interpretation: NOT DETECTED
C Diff toxin: NEGATIVE

## 2018-07-28 MED ORDER — ALBUTEROL SULFATE (2.5 MG/3ML) 0.083% IN NEBU: 2.5000 mg | INHALATION_SOLUTION | Freq: Four times a day (QID) | RESPIRATORY_TRACT | 12 refills | Status: AC | PRN

## 2018-07-28 MED ORDER — MONTELUKAST SODIUM 10 MG PO TABS: 10.0000 mg | ORAL_TABLET | Freq: Every day | ORAL | 0 refills | Status: AC

## 2018-07-28 MED ORDER — ZOLPIDEM TARTRATE 5 MG PO TABS: 5.0000 mg | ORAL_TABLET | Freq: Every evening | ORAL | 2 refills | Status: DC | PRN
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
## 2018-08-01 ENCOUNTER — Encounter: Payer: Self-pay | Admitting: Internal Medicine

## 2018-08-01 DIAGNOSIS — K219 Gastro-esophageal reflux disease without esophagitis: Secondary | ICD-10-CM | POA: Insufficient documentation

## 2018-08-01 DIAGNOSIS — J309 Allergic rhinitis, unspecified: Secondary | ICD-10-CM | POA: Insufficient documentation

## 2018-08-01 DIAGNOSIS — R748 Abnormal levels of other serum enzymes: Secondary | ICD-10-CM | POA: Insufficient documentation

## 2018-08-01 NOTE — Patient Instructions (Addendum)
Please call the clinic to schedule a Tdap vaccine  And fasting labs   Consider a sleep study    Sleep Apnea Sleep apnea affects breathing during sleep. It causes breathing to stop for a short time or to become shallow. It can also increase the risk of:  Heart attack.  Stroke.  Being very overweight (obese).  Diabetes.  Heart failure.  Irregular heartbeat. The goal of treatment is to help you breathe normally again. What are the causes? There are three kinds of sleep apnea:  Obstructive sleep apnea. This is caused by a blocked or collapsed airway.  Central sleep apnea. This happens when the brain does not send the right signals to the muscles that control breathing.  Mixed sleep apnea. This is a combination of obstructive and central sleep apnea. The most common cause of this condition is a collapsed or blocked airway. This can happen if:  Your throat muscles are too relaxed.  Your tongue and tonsils are too large.  You are overweight.  Your airway is too small. What increases the risk?  Being overweight.  Smoking.  Having a small airway.  Being older.  Being male.  Drinking alcohol.  Taking medicines to calm yourself (sedatives or tranquilizers).  Having family members with the condition. What are the signs or symptoms?  Trouble staying asleep.  Being sleepy or tired during the day.  Getting angry a lot.  Loud snoring.  Headaches in the morning.  Not being able to focus your mind (concentrate).  Forgetting things.  Less interest in sex.  Mood swings.  Personality changes.  Feelings of sadness (depression).  Waking up a lot during the night to pee (urinate).  Dry mouth.  Sore throat. How is this diagnosed?  Your medical history.  A physical exam.  A test that is done when you are sleeping (sleep study). The test is most often done in a sleep lab but may also be done at home. How is this treated?   Sleeping on your  side.  Using a medicine to get rid of mucus in your nose (decongestant).  Avoiding the use of alcohol, medicines to help you relax, or certain pain medicines (narcotics).  Losing weight, if needed.  Changing your diet.  Not smoking.  Using a machine to open your airway while you sleep, such as: ? An oral appliance. This is a mouthpiece that shifts your lower jaw forward. ? A CPAP device. This device blows air through a mask when you breathe out (exhale). ? An EPAP device. This has valves that you put in each nostril. ? A BPAP device. This device blows air through a mask when you breathe in (inhale) and breathe out.  Having surgery if other treatments do not work. It is important to get treatment for sleep apnea. Without treatment, it can lead to:  High blood pressure.  Coronary artery disease.  In men, not being able to have an erection (impotence).  Reduced thinking ability. Follow these instructions at home: Lifestyle  Make changes that your doctor recommends.  Eat a healthy diet.  Lose weight if needed.  Avoid alcohol, medicines to help you relax, and some pain medicines.  Do not use any products that contain nicotine or tobacco, such as cigarettes, e-cigarettes, and chewing tobacco. If you need help quitting, ask your doctor. General instructions  Take over-the-counter and prescription medicines only as told by your doctor.  If you were given a machine to use while you sleep, use it only  as told by your doctor.  If you are having surgery, make sure to tell your doctor you have sleep apnea. You may need to bring your device with you.  Keep all follow-up visits as told by your doctor. This is important. Contact a doctor if:  The machine that you were given to use during sleep bothers you or does not seem to be working.  You do not get better.  You get worse. Get help right away if:  Your chest hurts.  You have trouble breathing in enough air.  You have  an uncomfortable feeling in your back, arms, or stomach.  You have trouble talking.  One side of your body feels weak.  A part of your face is hanging down. These symptoms may be an emergency. Do not wait to see if the symptoms will go away. Get medical help right away. Call your local emergency services (911 in the [place] Do not drive yourself to the hospital. Summary  This condition affects breathing during sleep.  The most common cause is a collapsed or blocked airway.  The goal of treatment is to help you breathe normally while you sleep. This information is not intended to replace advice given to you by your health care provider. Make sure you discuss any questions you have with your health care provider. Document Released: 10/07/2007 Document Revised: 10/14/2017 Document Reviewed: 08/23/2017 Elsevier Patient Education  [place].    Tdap Vaccine (Tetanus, Diphtheria and Pertussis): What You Need to Know 1. Why get vaccinated? Tetanus, diphtheria and pertussis are very serious diseases. Tdap vaccine can protect [place] from these diseases. And, Tdap vaccine given to pregnant women can protect newborn babies against pertussis.Marland Kitchen TETANUS (Lockjaw) is rare in the [place] today. It causes painful muscle tightening and stiffness, usually all over the body.  It can lead to tightening of muscles in the head and neck so you can't open your mouth, swallow, or sometimes even breathe. Tetanus kills about 1 out of 10 people who are infected even after receiving the best medical care. DIPHTHERIA is also rare in the [place] today. It can cause a thick coating to form in the back of the throat.  It can lead to breathing problems, heart failure, paralysis, and death. PERTUSSIS (Whooping Cough) causes severe coughing spells, which can cause difficulty breathing, vomiting and disturbed sleep.  It can also lead to weight loss, incontinence, and rib fractures. Up to 2 in 100  adolescents and 5 in 100 adults with pertussis are hospitalized or have complications, which could include pneumonia or death. These diseases are caused by bacteria. Diphtheria and pertussis are spread from person to person through secretions from coughing or sneezing. Tetanus enters the body through cuts, scratches, or wounds. Before vaccines, as many as 200,000 cases of diphtheria, 200,000 cases of pertussis, and hundreds of cases of tetanus, were reported in the [place] each year. Since vaccination began, reports of cases for tetanus and diphtheria have dropped by about 99% and for pertussis by about 80%. 2. Tdap vaccine Tdap vaccine can protect adolescents and adults from tetanus, diphtheria, and pertussis. One dose of Tdap is routinely given at age 14 or 72. People who did not get Tdap at that age should get it as soon as possible. Tdap is especially important for healthcare professionals and anyone having close contact with a baby younger than 12 months. Pregnant women should get a dose of Tdap during every pregnancy, to protect the newborn from pertussis.  Infants are most at risk for severe, life-threatening complications from pertussis. Another vaccine, called Td, protects against tetanus and diphtheria, but not pertussis. A Td booster should be given every 10 years. Tdap may be given as one of these boosters if you have never gotten Tdap before. Tdap may also be given after a severe cut or burn to prevent tetanus infection. Your doctor or the person giving you the vaccine can give you more information. Tdap may safely be given at the same time as other vaccines. 3. Some people should not get this vaccine  A person who has ever had a life-threatening allergic reaction after a previous dose of any diphtheria, tetanus or pertussis containing vaccine, OR has a severe allergy to any part of this vaccine, should not get Tdap vaccine. Tell the person giving the vaccine about any severe  allergies.  Anyone who had coma or long repeated seizures within 7 days after a childhood dose of DTP or DTaP, or a previous dose of Tdap, should not get Tdap, unless a cause other than the vaccine was found. They can still get Td.  Talk to your doctor if you: ? have seizures or another nervous system problem, ? had severe pain or swelling after any vaccine containing diphtheria, tetanus or pertussis, ? ever had a condition called Guillain-Barr Syndrome (GBS), ? aren't feeling well on the day the shot is scheduled. 4. Risks With any medicine, including vaccines, there is a chance of side effects. These are usually mild and go away on their own. Serious reactions are also possible but are rare. Most people who get Tdap vaccine do not have any problems with it. Mild problems following Tdap (Did not interfere with activities)  Pain where the shot was given (about 3 in 4 adolescents or 2 in 3 adults)  Redness or swelling where the shot was given (about 1 person in 5)  Mild fever of at least 100.68F (up to about 1 in 25 adolescents or 1 in 100 adults)  Headache (about 3 or 4 people in 10)  Tiredness (about 1 person in 3 or 4)  Nausea, vomiting, diarrhea, stomach ache (up to 1 in 4 adolescents or 1 in 10 adults)  Chills, sore joints (about 1 person in 10)  Body aches (about 1 person in 3 or 4)  Rash, swollen glands (uncommon) Moderate problems following Tdap (Interfered with activities, but did not require medical attention)  Pain where the shot was given (up to 1 in 5 or 6)  Redness or swelling where the shot was given (up to about 1 in 16 adolescents or 1 in 12 adults)  Fever over 102F (about 1 in 100 adolescents or 1 in 250 adults)  Headache (about 1 in 7 adolescents or 1 in 10 adults)  Nausea, vomiting, diarrhea, stomach ache (up to 1 or 3 people in 100)  Swelling of the entire arm where the shot was given (up to about 1 in 500). Severe problems following Tdap (Unable  to perform usual activities; required medical attention)  Swelling, severe pain, bleeding and redness in the arm where the shot was given (rare). Problems that could happen after any vaccine:  People sometimes faint after a medical procedure, including vaccination. Sitting or lying down for about 15 minutes can help prevent fainting, and injuries caused by a fall. Tell your doctor if you feel dizzy, or have vision changes or ringing in the ears.  Some people get severe pain in the shoulder and have difficulty  moving the arm where a shot was given. This happens very rarely.  Any medication can cause a severe allergic reaction. Such reactions from a vaccine are very rare, estimated at fewer than 1 in a million doses, and would happen within a few minutes to a few hours after the vaccination. As with any medicine, there is a very remote chance of a vaccine causing a serious injury or death. The safety of vaccines is always being monitored. For more information, visit: http://www.aguilar.org/ 5. What if there is a serious problem? What should I look for?  Look for anything that concerns you, such as signs of a severe allergic reaction, very high fever, or unusual behavior. Signs of a severe allergic reaction can include hives, swelling of the face and throat, difficulty breathing, a fast heartbeat, dizziness, and weakness. These would usually start a few minutes to a few hours after the vaccination. What should I do?  If you think it is a severe allergic reaction or other emergency that can't wait, call 9-1-1 or get the person to the nearest hospital. Otherwise, call your doctor.  Afterward, the reaction should be reported to the Vaccine Adverse Event Reporting System (VAERS). Your doctor might file this report, or you can do it yourself through the VAERS web site at www.vaers.SamedayNews.es, or by calling 614-410-4846. VAERS does not give medical advice. 6. The National Vaccine Injury Compensation  Program The [employer] Vaccine Injury Compensation Program (VICP) is a federal program that was created to compensate people who may have been injured by certain vaccines. Persons who believe they may have been injured by a vaccine can learn about the program and about filing a claim by calling 213 343 3927 or visiting the [place] website at GoldCloset.com.ee. There is a time limit to file a claim for compensation. 7. How can I learn more?  Ask your doctor. He or she can give you the vaccine package insert or suggest other sources of information.  Call your local or state health department.  Contact the Centers for Disease Control and Prevention (CDC): ? Call 859 527 3347 (1-800-CDC-INFO) or ? Visit CDC's website at http://hunter.com/ Vaccine Information Statement Tdap Vaccine (03/06/2013) This information is not intended to replace advice given to you by your health care provider. Make sure you discuss any questions you have with your health care provider. Document Released: 06/29/2011 Document Revised: 08/15/2017 Document Reviewed: 08/15/2017 Elsevier Interactive Patient Education  [place].   Gastroesophageal Reflux Disease, Adult Gastroesophageal reflux (GER) happens when acid from the stomach flows up into the tube that connects the mouth and the stomach (esophagus). Normally, food travels down the esophagus and stays in the stomach to be digested. However, when a person has GER, food and stomach acid sometimes move back up into the esophagus. If this becomes a more serious problem, the person may be diagnosed with a disease called gastroesophageal reflux disease (GERD). GERD occurs when the reflux:  Happens often.  Causes frequent or severe symptoms.  Causes problems such as damage to the esophagus. When stomach acid comes in contact with the esophagus, the acid may cause soreness (inflammation) in the esophagus. Over time, GERD may create small holes (ulcers)  in the lining of the esophagus. What are the causes? This condition is caused by a problem with the muscle between the esophagus and the stomach (lower esophageal sphincter, or LES). Normally, the LES muscle closes after food passes through the esophagus to the stomach. When the LES is weakened or abnormal, it does not close  properly, and that allows food and stomach acid to go back up into the esophagus. The LES can be weakened by certain dietary substances, medicines, and medical conditions, including:  Tobacco use.  Pregnancy.  Having a hiatal hernia.  Alcohol use.  Certain foods and beverages, such as coffee, chocolate, onions, and peppermint. What increases the risk? You are more likely to develop this condition if you:  Have an increased body weight.  Have a connective tissue disorder.  Use NSAID medicines. What are the signs or symptoms? Symptoms of this condition include:  Heartburn.  Difficult or painful swallowing.  The feeling of having a lump in the throat.  Abitter taste in the mouth.  Bad breath.  Having a large amount of saliva.  Having an upset or bloated stomach.  Belching.  Chest pain. Different conditions can cause chest pain. Make sure you see your health care provider if you experience chest pain.  Shortness of breath or wheezing.  Ongoing (chronic) cough or a night-time cough.  Wearing away of tooth enamel.  Weight loss. How is this diagnosed? Your health care provider will take a medical history and perform a physical exam. To determine if you have mild or severe GERD, your health care provider may also monitor how you respond to treatment. You may also have tests, including:  A test to examine your stomach and esophagus with a small camera (endoscopy).  A test thatmeasures the acidity level in your esophagus.  A test thatmeasures how much pressure is on your esophagus.  A barium swallow or modified barium swallow test to show the  shape, size, and functioning of your esophagus. How is this treated? The goal of treatment is to help relieve your symptoms and to prevent complications. Treatment for this condition may vary depending on how severe your symptoms are. Your health care provider may recommend:  Changes to your diet.  Medicine.  Surgery. Follow these instructions at home: Eating and drinking   Follow a diet as recommended by your health care provider. This may involve avoiding foods and drinks such as: ? Coffee and tea (with or without caffeine). ? Drinks that containalcohol. ? Energy drinks and sports drinks. ? Carbonated drinks or sodas. ? Chocolate and cocoa. ? Peppermint and mint flavorings. ? Garlic and onions. ? Horseradish. ? Spicy and acidic foods, including peppers, chili powder, curry powder, vinegar, hot sauces, and barbecue sauce. ? Citrus fruit juices and citrus fruits, such as oranges, lemons, and limes. ? Tomato-based foods, such as red sauce, chili, salsa, and pizza with red sauce. ? Fried and fatty foods, such as donuts, french fries, potato chips, and high-fat dressings. ? High-fat meats, such as hot dogs and fatty cuts of red and white meats, such as rib eye steak, sausage, ham, and bacon. ? High-fat dairy items, such as whole milk, butter, and cream cheese.  Eat small, frequent meals instead of large meals.  Avoid drinking large amounts of liquid with your meals.  Avoid eating meals during the 2-3 hours before bedtime.  Avoid lying down right after you eat.  Do not exercise right after you eat. Lifestyle   Do not use any products that contain nicotine or tobacco, such as cigarettes, e-cigarettes, and chewing tobacco. If you need help quitting, ask your health care provider.  Try to reduce your stress by using methods such as yoga or meditation. If you need help reducing stress, ask your health care provider.  If you are overweight, reduce your weight to an  amount that is  healthy for you. Ask your health care provider for guidance about a safe weight loss goal. General instructions  Pay attention to any changes in your symptoms.  Take over-the-counter and prescription medicines only as told by your health care provider. Do not take aspirin, ibuprofen, or other NSAIDs unless your health care provider told you to do so.  Wear loose-fitting clothing. Do not wear anything tight around your waist that causes pressure on your abdomen.  Raise (elevate) the head of your bed about 6 inches (15 cm).  Avoid bending over if this makes your symptoms worse.  Keep all follow-up visits as told by your health care provider. This is important. Contact a health care provider if:  You have: ? New symptoms. ? Unexplained weight loss. ? Difficulty swallowing or it hurts to swallow. ? Wheezing or a persistent cough. ? A hoarse voice.  Your symptoms do not improve with treatment. Get help right away if you:  Have pain in your arms, neck, jaw, teeth, or back.  Feel sweaty, dizzy, or light-headed.  Have chest pain or shortness of breath.  Vomit and your vomit looks like blood or coffee grounds.  Faint.  Have stool that is bloody or black.  Cannot swallow, drink, or eat. Summary  Gastroesophageal reflux happens when acid from the stomach flows up into the esophagus. GERD is a disease in which the reflux happens often, causes frequent or severe symptoms, or causes problems such as damage to the esophagus.  Treatment for this condition may vary depending on how severe your symptoms are. Your health care provider may recommend diet and lifestyle changes, medicine, or surgery.  Contact a health care provider if you have new or worsening symptoms.  Take over-the-counter and prescription medicines only as told by your health care provider. Do not take aspirin, ibuprofen, or other NSAIDs unless your health care provider told you to do so.  Keep all follow-up visits as  told by your health care provider. This is important. This information is not intended to replace advice given to you by your health care provider. Make sure you discuss any questions you have with your health care provider. Document Released: 10/07/2004 Document Revised: 07/06/2017 Document Reviewed: 07/06/2017 Elsevier Patient Education  2020 [place] for Gastroesophageal Reflux Disease, Adult When you have gastroesophageal reflux disease (GERD), the foods you eat and your eating habits are very important. Choosing the right foods can help ease your discomfort. Think about working with a nutrition specialist (dietitian) to help you make good choices. What are tips for following this plan?  Meals  Choose healthy foods that are low in fat, such as fruits, vegetables, whole grains, low-fat dairy products, and lean meat, fish, and poultry.  Eat small meals often instead of 3 large meals a day. Eat your meals slowly, and in a place where you are relaxed. Avoid bending over or lying down until 2-3 hours after eating.  Avoid eating meals 2-3 hours before bed.  Avoid drinking a lot of liquid with meals.  Cook foods using methods other than frying. Bake, grill, or broil food instead.  Avoid or limit: ? Chocolate. ? Peppermint or spearmint. ? Alcohol. ? Pepper. ? Black and decaffeinated coffee. ? Black and decaffeinated tea. ? Bubbly (carbonated) soft drinks. ? Caffeinated energy drinks and soft drinks.  Limit high-fat foods such as: ? Fatty meat or fried foods. ? Whole milk, cream, butter, or ice cream. ? Nuts and nut butters. ?  Pastries, donuts, and sweets made with butter or shortening.  Avoid foods that cause symptoms. These foods may be different for everyone. Common foods that cause symptoms include: ? Tomatoes. ? Oranges, lemons, and limes. ? Peppers. ? Spicy food. ? Onions and garlic. ? Vinegar. Lifestyle  Maintain a healthy weight. Ask your doctor  what weight is healthy for you. If you need to lose weight, work with your doctor to do so safely.  Exercise for at least 30 minutes for 5 or more days each week, or as told by your doctor.  Wear loose-fitting clothes.  Do not smoke. If you need help quitting, ask your doctor.  Sleep with the head of your bed higher than your feet. Use a wedge under the mattress or blocks under the bed frame to raise the head of the bed. Summary  When you have gastroesophageal reflux disease (GERD), food and lifestyle choices are very important in easing your symptoms.  Eat small meals often instead of 3 large meals a day. Eat your meals slowly, and in a place where you are relaxed.  Limit high-fat foods such as fatty meat or fried foods.  Avoid bending over or lying down until 2-3 hours after eating.  Avoid peppermint and spearmint, caffeine, alcohol, and chocolate. This information is not intended to replace advice given to you by your health care provider. Make sure you discuss any questions you have with your health care provider. Document Released: 06/29/2011 Document Revised: 04/20/2018 Document Reviewed: 02/03/2016 Elsevier Patient Education  2020 [employer].

## 2018-08-02 ENCOUNTER — Telehealth: Payer: Self-pay | Admitting: Internal Medicine

## 2018-08-02 LAB — CALPROTECTIN, FECAL: Calprotectin, Fecal: 246 ug/g — ABNORMAL HIGH (ref 0–120)

## 2018-08-02 NOTE — Telephone Encounter (Signed)
I called pt and spoke with pt regarding scheduling. Pt states he will call back to sch after he gets test done.

## 2018-08-10 ENCOUNTER — Other Ambulatory Visit: Payer: Self-pay

## 2018-08-10 ENCOUNTER — Other Ambulatory Visit
Admission: RE | Admit: 2018-08-10 | Discharge: 2018-08-10 | Disposition: A | Discharge status: Home / Self Care | Payer: BLUE CROSS/BLUE SHIELD | Source: Ambulatory Visit | Attending: Internal Medicine | Admitting: Internal Medicine

## 2018-08-10 DIAGNOSIS — Z20828 Contact with and (suspected) exposure to other viral communicable diseases: Secondary | ICD-10-CM | POA: Insufficient documentation

## 2018-08-10 LAB — SARS CORONAVIRUS 2 (TAT 6-24 HRS): SARS Coronavirus 2: NEGATIVE

## 2018-08-14 ENCOUNTER — Ambulatory Visit: Payer: BLUE CROSS/BLUE SHIELD | Admitting: Anesthesiology

## 2018-08-14 ENCOUNTER — Ambulatory Visit
Admission: RE | Admit: 2018-08-14 | Discharge: 2018-08-14 | Disposition: A | Discharge status: Home / Self Care | Payer: BLUE CROSS/BLUE SHIELD | Attending: Internal Medicine | Admitting: Internal Medicine

## 2018-08-14 ENCOUNTER — Encounter: Payer: Self-pay | Admitting: Anesthesiology

## 2018-08-14 ENCOUNTER — Encounter
Admission: RE | Disposition: A | Discharge status: Home / Self Care | Payer: Self-pay | Source: Home / Self Care | Attending: Internal Medicine

## 2018-08-14 ENCOUNTER — Other Ambulatory Visit: Payer: Self-pay

## 2018-08-14 DIAGNOSIS — R112 Nausea with vomiting, unspecified: Secondary | ICD-10-CM | POA: Diagnosis not present

## 2018-08-14 DIAGNOSIS — D120 Benign neoplasm of cecum: Secondary | ICD-10-CM | POA: Diagnosis not present

## 2018-08-14 DIAGNOSIS — Z79899 Other long term (current) drug therapy: Secondary | ICD-10-CM | POA: Diagnosis not present

## 2018-08-14 DIAGNOSIS — J4540 Moderate persistent asthma, uncomplicated: Secondary | ICD-10-CM | POA: Insufficient documentation

## 2018-08-14 DIAGNOSIS — R1084 Generalized abdominal pain: Secondary | ICD-10-CM | POA: Insufficient documentation

## 2018-08-14 DIAGNOSIS — G4700 Insomnia, unspecified: Secondary | ICD-10-CM | POA: Insufficient documentation

## 2018-08-14 DIAGNOSIS — K529 Noninfective gastroenteritis and colitis, unspecified: Secondary | ICD-10-CM | POA: Insufficient documentation

## 2018-08-14 HISTORY — PX: ESOPHAGOGASTRODUODENOSCOPY (EGD) WITH PROPOFOL: SHX5813

## 2018-08-14 HISTORY — PX: COLONOSCOPY WITH PROPOFOL: SHX5780

## 2018-08-14 SURGERY — COLONOSCOPY WITH PROPOFOL
Anesthesia: General

## 2018-08-14 MED ORDER — IPRATROPIUM-ALBUTEROL 0.5-2.5 (3) MG/3ML IN SOLN
3.0000 mL | Freq: Four times a day (QID) | RESPIRATORY_TRACT | Status: DC
  Administered 2018-08-14: 14:00:00 3 mL via RESPIRATORY_TRACT

## 2018-08-14 MED ORDER — PROPOFOL 10 MG/ML IV BOLUS
INTRAVENOUS | Status: AC
  Filled 2018-08-14: qty 40

## 2018-08-14 MED ORDER — PROPOFOL 10 MG/ML IV BOLUS
INTRAVENOUS | Status: AC
  Filled 2018-08-14: qty 20

## 2018-08-14 MED ORDER — ALBUTEROL SULFATE (2.5 MG/3ML) 0.083% IN NEBU
2.5000 mg | INHALATION_SOLUTION | Freq: Once | RESPIRATORY_TRACT | Status: DC
  Filled 2018-08-14: qty 3

## 2018-08-14 MED ORDER — PROPOFOL 10 MG/ML IV BOLUS
INTRAVENOUS | Status: DC | PRN
  Administered 2018-08-14: 40 ug via INTRAVENOUS
  Administered 2018-08-14: 20 ug via INTRAVENOUS
  Administered 2018-08-14: 120 ug via INTRAVENOUS
  Administered 2018-08-14: 30 ug via INTRAVENOUS
  Administered 2018-08-14: 100 ug via INTRAVENOUS
  Administered 2018-08-14: 40 ug via INTRAVENOUS
  Administered 2018-08-14: 20 ug via INTRAVENOUS
  Administered 2018-08-14 (×3): 40 ug via INTRAVENOUS
  Administered 2018-08-14: 30 ug via INTRAVENOUS
  Administered 2018-08-14: 20 ug via INTRAVENOUS
  Administered 2018-08-14: 80 ug via INTRAVENOUS
  Administered 2018-08-14: 40 ug via INTRAVENOUS
  Administered 2018-08-14: 100 ug via INTRAVENOUS
  Administered 2018-08-14: 40 ug via INTRAVENOUS

## 2018-08-14 MED ORDER — IPRATROPIUM-ALBUTEROL 0.5-2.5 (3) MG/3ML IN SOLN
RESPIRATORY_TRACT | Status: AC
  Administered 2018-08-14: 14:00:00 3 mL via RESPIRATORY_TRACT
  Filled 2018-08-14: qty 3

## 2018-08-14 MED ORDER — SODIUM CHLORIDE 0.9 % IV SOLN
INTRAVENOUS | Status: DC
  Administered 2018-08-14: 14:00:00 via INTRAVENOUS
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
## 2018-08-14 NOTE — Anesthesia Post-op Follow-up Note (Signed)
Anesthesia QCDR form completed.        

## 2018-08-14 NOTE — Op Note (Signed)
[facility] Patient Name: Canon Gola Procedure Date: 08/14/2018 2:03 PM MRN: [id number] Account #: [id number] Date of Birth: 1985/09/22 Admit Type: Outpatient Age: 33 Room: [facility] ENDO ROOM 1 Gender: Male Note Status: Finalized Procedure:            Colonoscopy Indications:          Generalized abdominal pain, Diarrhea (presumed                        secondary to inflammatory bowel disease) Providers:            Benay Pike. Alice Reichert MD, MD Referring MD:         Nino Glow Mclean-Scocuzza MD, MD (Referring MD) Medicines:            Propofol per Anesthesia Complications:        No immediate complications. Procedure:            Pre-Anesthesia Assessment:                       - The risks and benefits of the procedure and the                        sedation options and risks were discussed with the                        patient. All questions were answered and informed                        consent was obtained.                       - Patient identification and proposed procedure were                        verified prior to the procedure by the nurse. The                        procedure was verified in the procedure room.                       - ASA Grade Assessment: III - A patient with severe                        systemic disease.                       - After reviewing the risks and benefits, the patient                        was deemed in satisfactory condition to undergo the                        procedure.                       After obtaining informed consent, the colonoscope was                        passed under direct vision. Throughout the procedure,  the patient's blood pressure, pulse, and oxygen                        saturations were monitored continuously. The                        Colonoscope was introduced through the anus and                        advanced to the the terminal ileum, with identification                         of the appendiceal orifice and IC valve. The                        colonoscopy was performed without difficulty. The                        patient tolerated the procedure well. The quality of                        the bowel preparation was adequate. The terminal ileum,                        ileocecal valve, appendiceal orifice, and rectum were                        photographed. Findings:      The perianal and digital rectal examinations were normal. Pertinent       negatives include normal sphincter tone and no palpable rectal lesions.      The terminal ileum appeared normal. Biopsies were taken with a cold       forceps for histology.      A 10 mm polyp was found in the cecum. The polyp was semi-pedunculated.       The polyp was removed with a cold snare. Resection and retrieval were       complete.      Normal mucosa was found in the entire colon. Biopsies for histology were       taken with a cold forceps from the right colon, left colon and rectum       for evaluation of microscopic colitis.      The exam was otherwise without abnormality. Impression:           - The examined portion of the ileum was normal.                        Biopsied.                       - One 10 mm polyp in the cecum, removed with a cold                        snare. Resected and retrieved.                       - Normal mucosa in the entire examined colon. Biopsied.                       - The examination was otherwise normal. Recommendation:       -  Patient has a contact number available for                        emergencies. The signs and symptoms of potential                        delayed complications were discussed with the patient.                        Return to normal activities tomorrow. Written discharge                        instructions were provided to the patient.                       - Resume previous diet.                       - Continue present  medications.                       - Await pathology results.                       - Return to physician assistant in 1 month.                       - Please follow up with Tammi Klippel, PA-C for your                        visit to [facility]. Of course, I                        remain available to you if you need any help. Procedure Code(s):    --- Professional ---                       [phone], Colonoscopy, flexible; with removal of tumor(s),                        polyp(s), or other lesion(s) by snare technique                       45380, 73, Colonoscopy, flexible; with biopsy, single                        or multiple Diagnosis Code(s):    --- Professional ---                       R19.7, Diarrhea, unspecified                       R10.84, Generalized abdominal pain                       K63.5, Polyp of colon CPT copyright 2019 American Medical Association. All rights reserved. The codes documented in this report are preliminary and upon coder review may  be revised to meet current compliance requirements. Efrain Sella MD, MD 08/14/2018 2:43:33 PM This report has been signed electronically. Number of Addenda: 0 Note Initiated On: 08/14/2018 2:03 PM Scope Withdrawal Time: 0 hours 11 minutes 14 seconds  Total Procedure Duration: 0 hours 18 minutes 41 seconds  Estimated Blood Loss: Estimated blood loss was minimal.      [facility]
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
## 2018-08-14 NOTE — Transfer of Care (Signed)
Immediate Anesthesia Transfer of Care Note  Patient: EVERETTE MALL  Procedure(s) Performed: COLONOSCOPY WITH PROPOFOL (N/A ) ESOPHAGOGASTRODUODENOSCOPY (EGD) WITH PROPOFOL (N/A )  Patient Location: Endoscopy Unit  Anesthesia Type:General  Level of Consciousness: awake, alert  and oriented  Airway & Oxygen Therapy: Patient Spontanous Breathing  Post-op Assessment: Report given to RN and Post -op Vital signs reviewed and stable  Post vital signs: Reviewed and stable  Last Vitals:  Vitals Value Taken Time  BP 125/73 08/14/18 1442  Temp 36.4 C 08/14/18 1442  Pulse 88 08/14/18 1445  Resp 17 08/14/18 1445  SpO2 96 % 08/14/18 1445  Vitals shown include unvalidated device data.  Last Pain:  Vitals:   08/14/18 1442  TempSrc: Tympanic  PainSc: Asleep         Complications: No apparent anesthesia complications

## 2018-08-14 NOTE — Interval H&P Note (Signed)
History and Physical Interval Note:  08/14/2018 1:14 PM  Ronnie Chandler  has presented today for surgery, with the diagnosis of ABD PAIN  DIARRHEA.  The various methods of treatment have been discussed with the patient and family. After consideration of risks, benefits and other options for treatment, the patient has consented to  Procedure(s): COLONOSCOPY WITH PROPOFOL (N/A) ESOPHAGOGASTRODUODENOSCOPY (EGD) WITH PROPOFOL (N/A) as a surgical intervention.  The patient's history has been reviewed, patient examined, no change in status, stable for surgery.  I have reviewed the patient's chart and labs.  Questions were answered to the patient's satisfaction.     [place], [place]

## 2018-08-14 NOTE — Op Note (Signed)
[facility]enter[facility] Patient Name: Ronnie Chandler Procedure Date: 08/14/2018 2:04 PM MRN: [id number] Account #: [id number] Date of Birth: Apr 04, 1985 Admit Type: Outpatient Age: 33 Room: [facility] ENDO ROOM 1 Gender: Male Note Status: Finalized Procedure:            Upper GI endoscopy Indications:          Generalized abdominal pain, Suspected esophageal                        reflux, Nausea with vomiting Providers:            Benay Pike. Alice Reichert MD, MD Referring MD:         Nino Glow Mclean-Scocuzza MD, MD (Referring MD) Medicines:            Propofol per Anesthesia Complications:        No immediate complications. Procedure:            Pre-Anesthesia Assessment:                       - The risks and benefits of the procedure and the                        sedation options and risks were discussed with the                        patient. All questions were answered and informed                        consent was obtained.                       - Patient identification and proposed procedure were                        verified prior to the procedure by the nurse. The                        procedure was verified in the procedure room.                       - ASA Grade Assessment: III - A patient with severe                        systemic disease.                       - After reviewing the risks and benefits, the patient                        was deemed in satisfactory condition to undergo the                        procedure.                       After obtaining informed consent, the endoscope was                        passed under direct vision. Throughout the procedure,  the patient's blood pressure, pulse, and oxygen                        saturations were monitored continuously. The Endoscope                        was introduced through the mouth, and advanced to the                        third part of duodenum. The upper GI endoscopy  was                        accomplished without difficulty. The patient tolerated                        the procedure well. Findings:      The esophagus was normal.      The stomach was normal.      The examined duodenum was normal. Impression:           - Normal esophagus.                       - Normal stomach.                       - Normal examined duodenum.                       - No specimens collected. Recommendation:       - Proceed with colonoscopy Procedure Code(s):    --- Professional ---                       [phone], Esophagogastroduodenoscopy, flexible, transoral;                        diagnostic, including collection of specimen(s) by                        brushing or washing, when performed (separate procedure) Diagnosis Code(s):    --- Professional ---                       R11.2, Nausea with vomiting, unspecified                       R10.84, Generalized abdominal pain CPT copyright 2019 American Medical Association. All rights reserved. The codes documented in this report are preliminary and upon coder review may  be revised to meet current compliance requirements. Efrain Sella MD, MD 08/14/2018 2:16:57 PM This report has been signed electronically. Number of Addenda: 0 Note Initiated On: 08/14/2018 2:04 PM Estimated Blood Loss: Estimated blood loss: none.      [facility]

## 2018-08-15 ENCOUNTER — Encounter: Payer: Self-pay | Admitting: Internal Medicine

## 2018-08-16 LAB — SURGICAL PATHOLOGY

## 2018-08-16 NOTE — Anesthesia Postprocedure Evaluation (Signed)
Anesthesia Post Note  Patient: Ronnie Chandler  Procedure(s) Performed: COLONOSCOPY WITH PROPOFOL (N/A ) ESOPHAGOGASTRODUODENOSCOPY (EGD) WITH PROPOFOL (N/A )  Patient location during evaluation: Endoscopy Anesthesia Type: General Level of consciousness: awake and alert and oriented Pain management: pain level controlled Vital Signs Assessment: post-procedure vital signs reviewed and stable Respiratory status: spontaneous breathing Cardiovascular status: blood pressure returned to baseline Anesthetic complications: no     Last Vitals:  Vitals:   08/14/18 1452 08/14/18 1502  BP: 120/77 123/78  Pulse: 86 84  Resp: 13 16  Temp:    SpO2: 96% 97%    Last Pain:  Vitals:   08/15/18 0728  TempSrc:   PainSc: 0-No pain                 Kestrel Mis

## 2018-09-21 ENCOUNTER — Telehealth: Payer: Self-pay | Admitting: Internal Medicine

## 2018-09-21 DIAGNOSIS — G4700 Insomnia, unspecified: Secondary | ICD-10-CM

## 2018-09-21 MED ORDER — ZOLPIDEM TARTRATE 5 MG PO TABS: 5.0000 mg | ORAL_TABLET | Freq: Every evening | ORAL | 0 refills | Status: AC | PRN

## 2018-09-21 NOTE — Telephone Encounter (Signed)
Medication Refill - Medication: zolpidem (AMBIEN) 5 MG tablet  Has the patient contacted their pharmacy? No - pt wants to know if he can have a stronger dose. (Agent: If no, request that the patient contact the pharmacy for the refill.) (Agent: If yes, when and what did the pharmacy advise?)  Preferred Pharmacy (with phone number or street name): [facility] [place] [phone] (Phone) [phone] (Fax)   Agent: Please be advised that RX refills may take up to 3 business days. We ask that you follow-up with your pharmacy.

## 2018-09-21 NOTE — Telephone Encounter (Signed)
Requested medication (s) are due for refill today: yes  Requested medication (s) are on the active medication list: yes  Last refill:  07/28/2018  Future visit scheduled: no  Notes to clinic:  This refill cannot be delegated   Requested Prescriptions  Pending Prescriptions Disp Refills   zolpidem (AMBIEN) 5 MG tablet 30 tablet 2    Sig: Take 1 tablet (5 mg total) by mouth at bedtime as needed for sleep.     Not Delegated - Psychiatry:  Anxiolytics/Hypnotics Failed - 09/21/2018 12:25 PM      Failed - This refill cannot be delegated      Failed - Urine Drug Screen completed in last 360 days.      Passed - Valid encounter within last 6 months    Recent Outpatient Visits          1 month ago Moderate persistent asthma without complication   [facility] McLean-Scocuzza, Nino Glow, MD

## 2018-09-25 NOTE — Telephone Encounter (Signed)
Patient would like to up his dose to 10mg . Patient states he did speak to PCP. Patient would like new prescription sent to same pharmacy

## 2018-09-26 ENCOUNTER — Other Ambulatory Visit: Payer: Self-pay | Admitting: Internal Medicine

## 2018-09-26 DIAGNOSIS — F5104 Psychophysiologic insomnia: Secondary | ICD-10-CM

## 2018-09-26 NOTE — Telephone Encounter (Signed)
Will not be refilling ambien for sleep he had +UDS THC Does he want referral to psychiatry for sleep issues    Ronnie Chandler

## 2018-09-26 NOTE — Telephone Encounter (Signed)
Spoke with pt to let him know that Dr. Olivia Mackie will not be refilling his Ronnie Chandler because of a positive drug screen for THC. The pt stated that he would like to go forward with the referral.

## 2018-09-26 NOTE — Telephone Encounter (Signed)
Referred to [facility] chronic insomnia  TMS

## 2018-11-03 ENCOUNTER — Ambulatory Visit: Payer: BLUE CROSS/BLUE SHIELD | Admitting: Internal Medicine

## 2018-11-06 ENCOUNTER — Telehealth: Payer: Self-pay | Admitting: Internal Medicine

## 2018-11-06 NOTE — Telephone Encounter (Signed)
Patient is calling he had a call from psychiatrist a referral that was placed on his behalf. Can the patient have the number to psychiatrist again? Or can the referral be placed again please? Thanks  Cb- [phone]

## 2019-12-17 ENCOUNTER — Emergency Department: Payer: BLUE CROSS/BLUE SHIELD

## 2019-12-17 ENCOUNTER — Other Ambulatory Visit: Payer: Self-pay

## 2019-12-17 ENCOUNTER — Encounter: Payer: Self-pay | Admitting: Emergency Medicine

## 2019-12-17 ENCOUNTER — Emergency Department
Admission: EM | Admit: 2019-12-17 | Discharge: 2019-12-17 | Disposition: A | Discharge status: Home / Self Care | Payer: BLUE CROSS/BLUE SHIELD | Attending: Emergency Medicine | Admitting: Emergency Medicine

## 2019-12-17 DIAGNOSIS — Z79899 Other long term (current) drug therapy: Secondary | ICD-10-CM | POA: Insufficient documentation

## 2019-12-17 DIAGNOSIS — J4540 Moderate persistent asthma, uncomplicated: Secondary | ICD-10-CM | POA: Insufficient documentation

## 2019-12-17 DIAGNOSIS — Z20822 Contact with and (suspected) exposure to covid-19: Secondary | ICD-10-CM | POA: Diagnosis not present

## 2019-12-17 DIAGNOSIS — J189 Pneumonia, unspecified organism: Secondary | ICD-10-CM | POA: Insufficient documentation

## 2019-12-17 DIAGNOSIS — R0789 Other chest pain: Secondary | ICD-10-CM | POA: Diagnosis not present

## 2019-12-17 DIAGNOSIS — R079 Chest pain, unspecified: Secondary | ICD-10-CM | POA: Diagnosis present

## 2019-12-17 DIAGNOSIS — R0600 Dyspnea, unspecified: Secondary | ICD-10-CM

## 2019-12-17 DIAGNOSIS — R0609 Other forms of dyspnea: Secondary | ICD-10-CM

## 2019-12-17 LAB — RESP PANEL BY RT-PCR (FLU A&B, COVID) ARPGX2
Influenza A by PCR: NEGATIVE
Influenza B by PCR: NEGATIVE
SARS Coronavirus 2 by RT PCR: NEGATIVE

## 2019-12-17 LAB — BASIC METABOLIC PANEL
Anion gap: 9 (ref 5–15)
BUN: 10 mg/dL (ref 6–20)
CO2: 26 mmol/L (ref 22–32)
Calcium: 9 mg/dL (ref 8.9–10.3)
Chloride: 105 mmol/L (ref 98–111)
Creatinine, Ser: 0.88 mg/dL (ref 0.61–1.24)
GFR, Estimated: >60 mL/min (ref >60)
Glucose, Bld: 108 mg/dL — ABNORMAL HIGH (ref 70–99)
Potassium: 3.8 mmol/L (ref 3.5–5.1)
Sodium: 140 mmol/L (ref 135–145)

## 2019-12-17 LAB — CBC
HCT: 42.7 % (ref 39.0–52.0)
Hemoglobin: 14.2 g/dL (ref 13.0–17.0)
MCH: 31.1 pg (ref 26.0–34.0)
MCHC: 33.3 g/dL (ref 30.0–36.0)
MCV: 93.6 fL (ref 80.0–100.0)
Platelets: 412 10*3/uL — ABNORMAL HIGH (ref 150–400)
RBC: 4.56 MIL/uL (ref 4.22–5.81)
RDW: 15.1 % (ref 11.5–15.5)
WBC: 9 10*3/uL (ref 4.0–10.5)
nRBC: 0 % (ref 0.0–0.2)

## 2019-12-17 LAB — TROPONIN I (HIGH SENSITIVITY)
Troponin I (High Sensitivity): 3 ng/L (ref <18)
Troponin I (High Sensitivity): 3 ng/L (ref <18)

## 2019-12-17 MED ORDER — AMOXICILLIN 500 MG PO CAPS: 1000.0000 mg | ORAL_CAPSULE | Freq: Three times a day (TID) | ORAL | 0 refills | Status: AC

## 2019-12-17 NOTE — ED Notes (Signed)
Patient transported to X-ray 

## 2019-12-17 NOTE — ED Triage Notes (Signed)
Pt to ED from home c/o chest pain, SOB, and palpitations for a couple weeks but last 48 hours has been constant.  States left mid chest, also got light headed and dizzy today and had near syncopal episode when bending over at home.  Denies n/v/d, denies cough.  States palpitations and pain becomes worse when lying flat.  Pt A&Ox4, chest rise even and unlabored, skin WNL, in NAD at this time.

## 2019-12-17 NOTE — ED Notes (Signed)
Pt verbalized discharge instructions and has no questions at  This time.
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
## 2019-12-17 NOTE — Discharge Instructions (Signed)
As we discussed, we believe that the more acute symptoms you are experiencing are the result of a small pneumonia in your right lower lobe of your lung.  Please take the full 5-day course of antibiotics as prescribed and continue using your breathing treatments.  Follow-up with your regular doctor.  However because you have been having symptoms that have been lasting a lot longer, such as that shortness of breath with exertion, shortness of breath when lying flat, and occasional chest pain, we encourage you to call the office of Dr. Clayborn Bigness and set up an appointment with him or one of his cardiology colleagues.  They can discuss with you whether you would benefit from additional evaluation, such as echocardiograms, stress test, etc.  Continue taking your regular medications.  Return to the emergency department if you develop new or worsening symptoms that concern you.

## 2020-03-27 IMAGING — CT CT ABDOMEN AND PELVIS WITH CONTRAST
2 of 4 series · 17 of 46 positions shown, 19 images · IV contrast (omnipaque)
Comparison: None.

CLINICAL DATA: Lower quadrant abdominal pain, nausea, vomiting,
diarrhea, IBS

EXAM:
CT ABDOMEN AND PELVIS WITH CONTRAST
TECHNIQUE: Multidetector CT imaging of the abdomen and pelvis was performed
using the standard protocol following bolus administration of
intravenous contrast.
CONTRAST:  100mL OMNIPAQUE IOHEXOL 300 MG/ML SOLN, additional oral
enteric contrast

[Series 2: abd pelvis · axial · 0.94mm/px · z∈[-1531,-1091]mm · 14 of 98 slices shown, 16 images]
[im 5/98  soft-tissue]
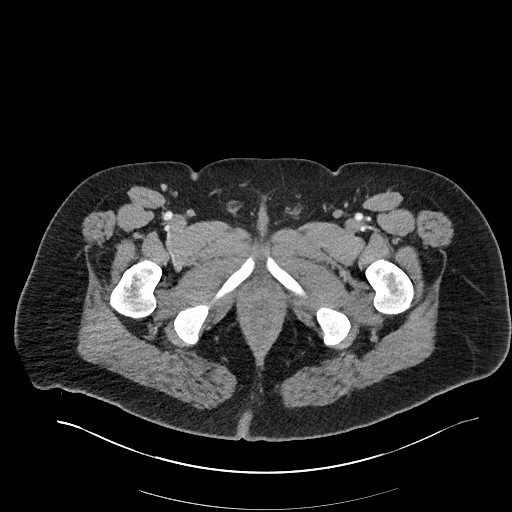
[im 5/98  bone]
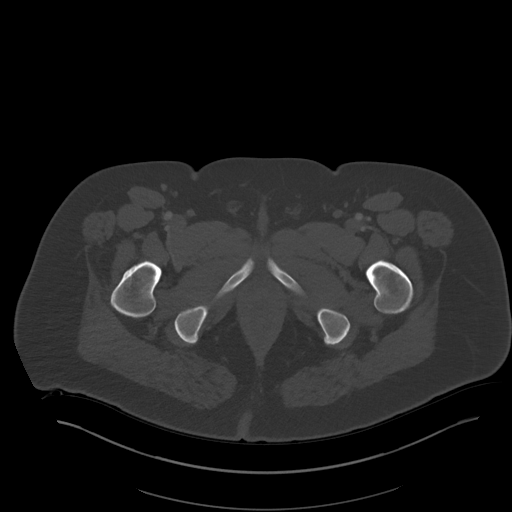
[im 13/98  soft-tissue]
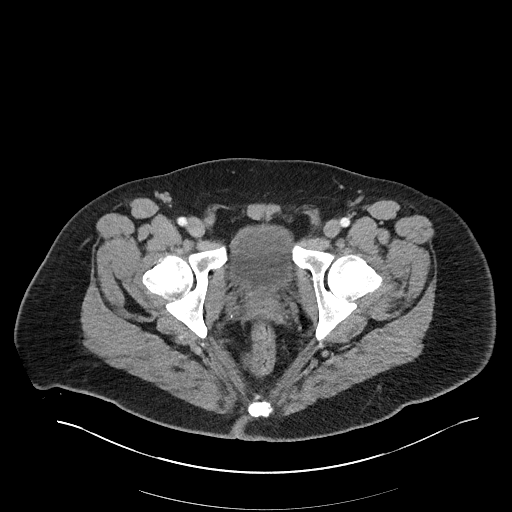
[im 17/98  soft-tissue]
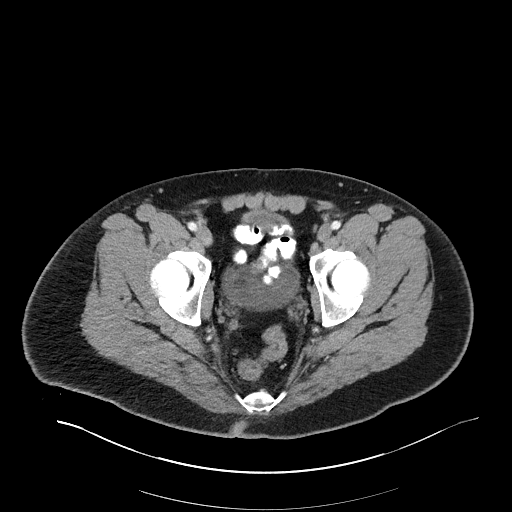
[im 26/98  soft-tissue]
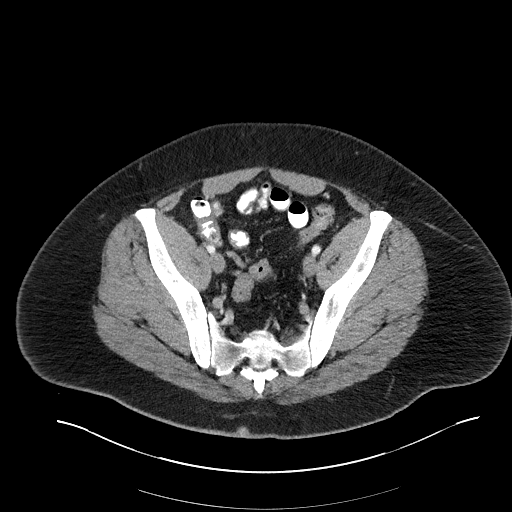
[im 34/98  soft-tissue]
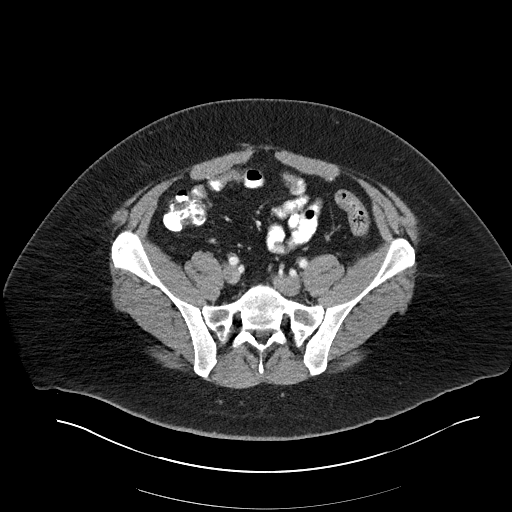
[im 38/98  soft-tissue]
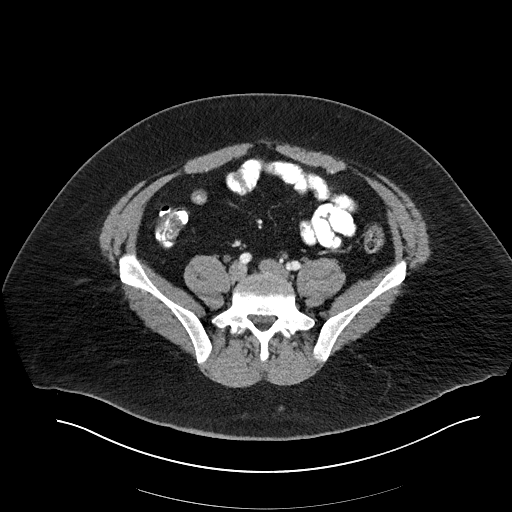
[im 47/98  soft-tissue]
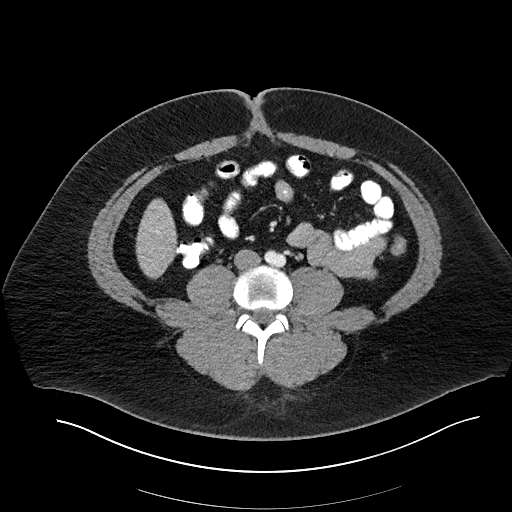
[im 51/98  soft-tissue]
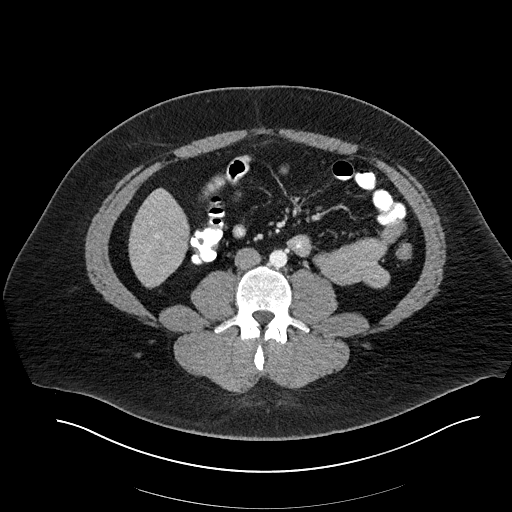
[im 60/98  soft-tissue]
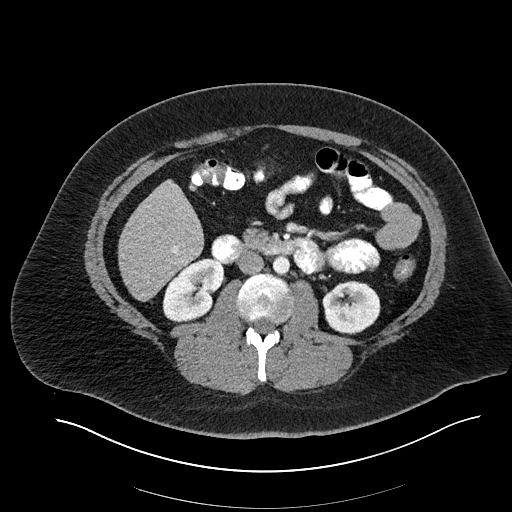
[im 60/98  bone]
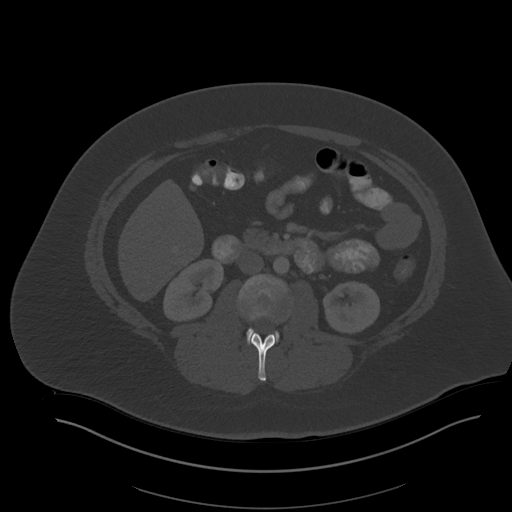
[im 64/98  soft-tissue]
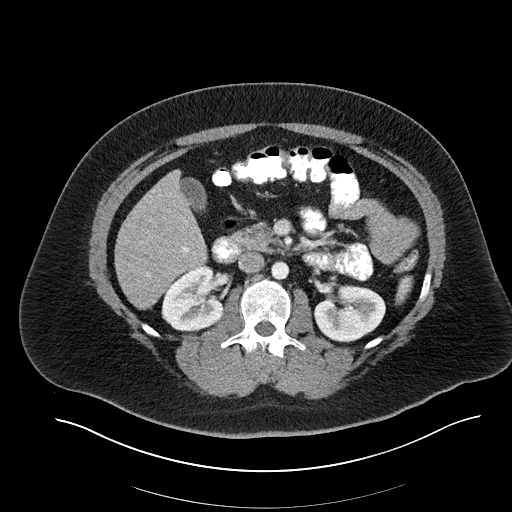
[im 72/98  soft-tissue]
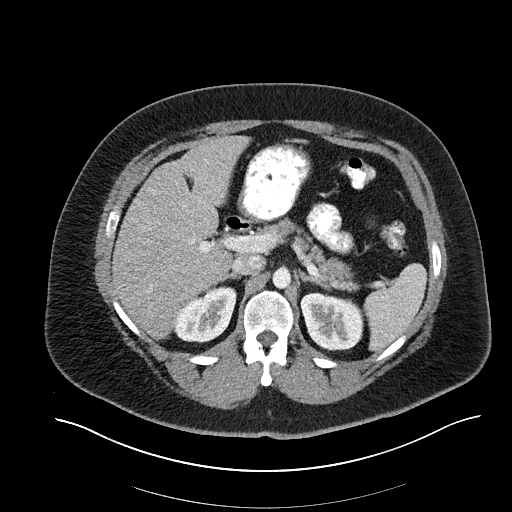
[im 81/98  soft-tissue]
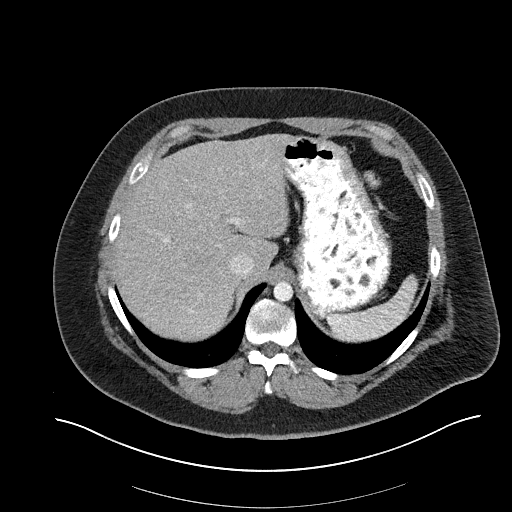
[im 85/98  soft-tissue]
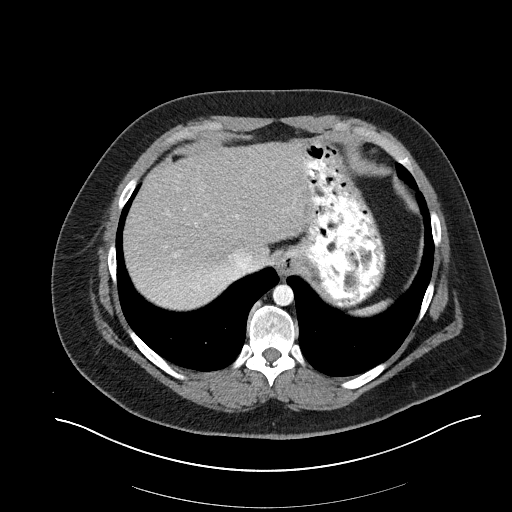
[im 93/98  soft-tissue]
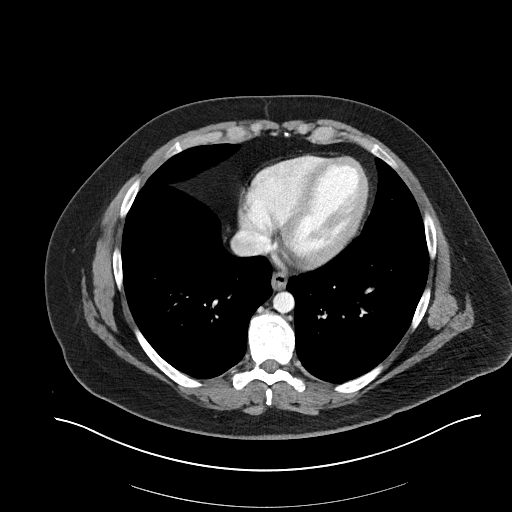

[Series 4: coronal abd pelvis · coronal · 0.93mm/px · 3 of 171 slices shown]
[im 57/171  soft-tissue]
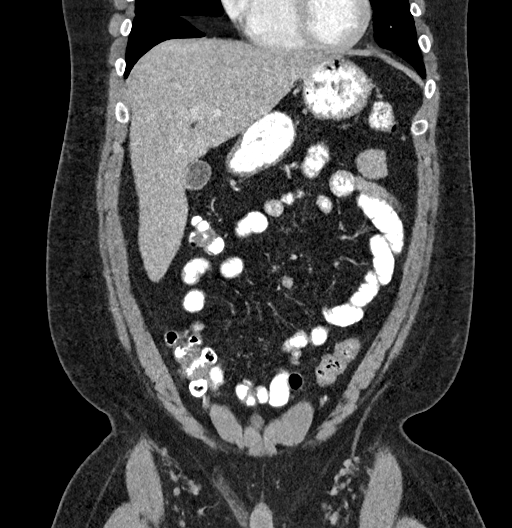
[im 76/171  soft-tissue]
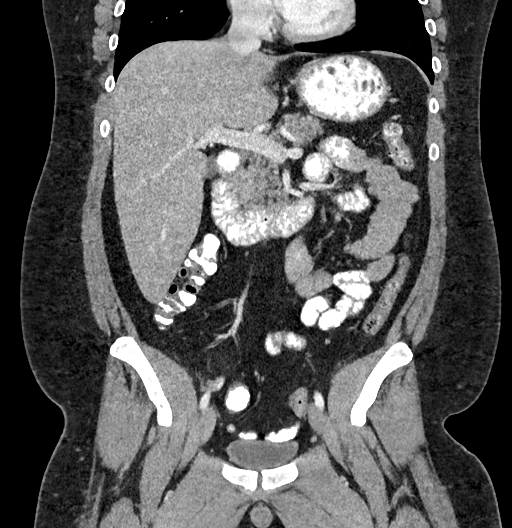
[im 95/171  soft-tissue]
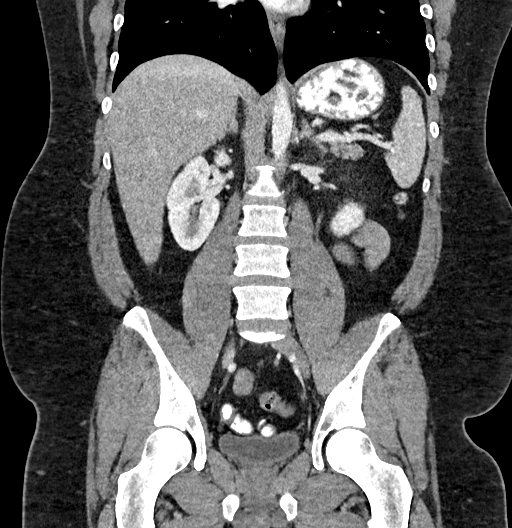

[17 of 46 positions shown; findings below may reference images not displayed]

FINDINGS: Lower chest: No acute abnormality.

Hepatobiliary: No solid liver abnormality is seen. No gallstones,
gallbladder wall thickening, or biliary dilatation.

Pancreas: Unremarkable. No pancreatic ductal dilatation or
surrounding inflammatory changes.

Spleen: Normal in size without significant abnormality.

Adrenals/Urinary Tract: Adrenal glands are unremarkable. Kidneys are
normal, without renal calculi, solid lesion, or hydronephrosis.
Bladder is unremarkable.

Stomach/Bowel: Stomach is within normal limits. Appendix appears
normal. No evidence of bowel wall thickening, distention, or
inflammatory changes.

Vascular/Lymphatic: No significant vascular findings are present. No
enlarged abdominal or pelvic lymph nodes.

Reproductive: No mass or other significant abnormality.

Other: Evidence of prior umbilical hernia repair. No abdominopelvic
ascites.

Musculoskeletal: No acute or significant osseous findings.
IMPRESSION: 1. No CT findings of the abdomen or pelvis to explain pain, nausea,
vomiting, or diarrhea. No evidence of acute or chronic bowel
inflammation.

2.  Evidence of prior umbilical hernia repair.

## 2021-09-07 ENCOUNTER — Emergency Department: Payer: BLUE CROSS/BLUE SHIELD

## 2021-09-07 ENCOUNTER — Other Ambulatory Visit: Payer: Self-pay

## 2021-09-07 ENCOUNTER — Emergency Department
Admission: EM | Admit: 2021-09-07 | Discharge: 2021-09-07 | Disposition: A | Discharge status: Home / Self Care | Payer: BLUE CROSS/BLUE SHIELD | Attending: Emergency Medicine | Admitting: Emergency Medicine

## 2021-09-07 DIAGNOSIS — J45909 Unspecified asthma, uncomplicated: Secondary | ICD-10-CM | POA: Diagnosis not present

## 2021-09-07 DIAGNOSIS — R079 Chest pain, unspecified: Secondary | ICD-10-CM

## 2021-09-07 LAB — CBC
HCT: 45.2 % (ref 39.0–52.0)
Hemoglobin: 15.5 g/dL (ref 13.0–17.0)
MCH: 33.3 pg (ref 26.0–34.0)
MCHC: 34.3 g/dL (ref 30.0–36.0)
MCV: 97 fL (ref 80.0–100.0)
Platelets: 356 10*3/uL (ref 150–400)
RBC: 4.66 MIL/uL (ref 4.22–5.81)
RDW: 14.9 % (ref 11.5–15.5)
WBC: 7.4 10*3/uL (ref 4.0–10.5)
nRBC: 0 % (ref 0.0–0.2)

## 2021-09-07 LAB — BASIC METABOLIC PANEL
Anion gap: 9 (ref 5–15)
BUN: 6 mg/dL (ref 6–20)
CO2: 24 mmol/L (ref 22–32)
Calcium: 8.5 mg/dL — ABNORMAL LOW (ref 8.9–10.3)
Chloride: 105 mmol/L (ref 98–111)
Creatinine, Ser: 0.85 mg/dL (ref 0.61–1.24)
GFR, Estimated: >60 mL/min (ref >60)
Glucose, Bld: 118 mg/dL — ABNORMAL HIGH (ref 70–99)
Potassium: 3.5 mmol/L (ref 3.5–5.1)
Sodium: 138 mmol/L (ref 135–145)

## 2021-09-07 LAB — TROPONIN I (HIGH SENSITIVITY)
Troponin I (High Sensitivity): 6 ng/L (ref <18)
Troponin I (High Sensitivity): 4 ng/L (ref <18)

## 2021-09-07 MED ORDER — MELOXICAM 15 MG PO TABS: 15.0000 mg | ORAL_TABLET | Freq: Every day | ORAL | 11 refills | Status: AC

## 2021-09-07 NOTE — ED Provider Triage Note (Signed)
Emergency Medicine Provider Triage Evaluation Note  Ronnie Chandler, a 36 y.o. male  was evaluated in triage.  Pt complains of left-sided chest pain.  Patient notes referral of pain to the bilateral anterior rib border.  He notes intermittent episodes of the last few weeks but now presents because of pain referring down his left upper arm.  He reports numbness and paresthesias to the left arm.  Denies any shortness of breath, diaphoresis, nausea, vomiting, or syncope.  Review of Systems  Positive: CP Negative: NVD  Physical Exam  BP (!) 142/100   Pulse 99   Temp 98.6 F (37 C)   Resp 18   Wt 127.9 kg   SpO2 96%   BMI 39.33 kg/m  Gen:   Awake, no distress  NAD Resp:  Normal effort CTA MSK:   Moves extremities without difficulty  CVS:  RRR  Medical Decision Making  Medically screening exam initiated at 5:40 PM.  Appropriate orders placed.  Ronnie Chandler was informed that the remainder of the evaluation will be completed by another provider, this initial triage assessment does not replace that evaluation, and the importance of remaining in the ED until their evaluation is complete.  Patient to the ED for evaluation of intermit episodes of left-sided chest pain with referral into the anterior rib.  He presents today due to onset of left upper extremity paresthesias.   Ronnie Needles, PA-C 09/07/21 1741

## 2021-09-07 NOTE — ED Notes (Signed)
Pt Dc to home. DC instructions reviewed with all questions answered. Pt verbalizes understanding. Pt ambulatory out of dept with steady gait.

## 2021-09-07 NOTE — ED Provider Notes (Signed)
[facility] Provider Note    Event Date/Time   First MD Initiated Contact with Patient 09/07/21 1831     (approximate)   History   Chief Complaint Chest Pain   HPI Ronnie Chandler is a 36 y.o. male, history of asthma, insomnia, presents emergency department for evaluation of central chest pain and pain in bilateral lower ribs.  Patient states that has been occurring intermittently for the past few weeks, but now has radiation into his left arm.  Patient states that it hurts worse to breathe than in his particular tender to palpation along the anterior aspect of his chest.  Denies shortness of breath, abdominal pain, nausea/vomiting, diarrhea, headache, rash/lesions, or dizziness/lightheadedness.  History Limitations: No limitations.        Physical Exam  Triage Vital Signs: ED Triage Vitals [09/07/21 1629]  Enc Vitals Group     BP (!) 142/100     Pulse Rate 99     Resp 18     Temp 98.6 F (37 C)     Temp src      SpO2 96 %     Weight 282 lb (127.9 kg)     Height      Head Circumference      Peak Flow      Pain Score 7     Pain Loc      Pain Edu?      Excl. in [place]?     Most recent vital signs: Vitals:   09/07/21 1629 09/07/21 1959  BP: (!) 142/100 131/88  Pulse: 99 78  Resp: 18 18  Temp: 98.6 F (37 C) 98.1 F (36.7 C)  SpO2: 96% 96%    General: Awake, NAD.  Skin: Warm, dry. No rashes or lesions.  Eyes: PERRL. Conjunctivae normal.  CV: Good peripheral perfusion.  Resp: Normal effort.  Abd: Soft, non-tender. No distention.  Neuro: At baseline. No gross neurological deficits.  Musculoskeletal: Normal ROM of all extremities.   Focused Exam: No rashes or lesions along the chest wall.  Patient does have tenderness along the anterior aspect of his chest, particular adjacent to the sternum, but also along the lateral aspect of the lower ribs.   Physical Exam    ED Results / Procedures / Treatments  Labs (all labs ordered are  listed, but only abnormal results are displayed) Labs Reviewed  BASIC METABOLIC PANEL - Abnormal; Notable for the following components:      Result Value   Glucose, Bld 118 (*)    Calcium 8.5 (*)    All other components within normal limits  CBC  TROPONIN I (HIGH SENSITIVITY)  TROPONIN I (HIGH SENSITIVITY)     EKG Sinus rhythm, rate of 97, no T-segment changes, normal QRS interval, no QT prolongation, no AV blocks.  I personally reviewed and interpreted this x-ray, no evidence of acute cardiopulmonary disease.   RADIOLOGY  ED Provider Interpretation: I personally viewed and interpreted this x-ray, no evidence of acute cardiopulmonary abnormalities.  DG Chest 2 View  Result Date: 09/07/2021 CLINICAL DATA:  Chest pain EXAM: CHEST - 2 VIEW COMPARISON:  12/17/2019 FINDINGS: The heart size and mediastinal contours are within normal limits. Both lungs are clear. The visualized skeletal structures are unremarkable. IMPRESSION: No active cardiopulmonary disease. Electronically Signed   By: Elmer Picker M.D.   On: 09/07/2021 16:59    PROCEDURES:  Critical Care performed: N/A.  Procedures    MEDICATIONS ORDERED IN ED: Medications - No data  to display   IMPRESSION / MDM / [place] / ED COURSE  I reviewed the triage vital signs and the nursing notes.                              Differential diagnosis includes, but is not limited to, ACS, myocarditis, pericarditis, anxiety, costochondritis, Tietz syndrome, pleurisy, pulmonary embolism.  ED Course Patient appears well, vitals are within normal limits.  NAD.  CBC shows no leukocytosis or anemia.  BMP shows no electrolyte abnormalities or AKI.  Initial troponin 6.  Second troponin 4.   Assessment/Plan Patient presents with central chest pain with pain in the lateral aspect of the lower ribs as well x3 weeks.  Pain is pleuritic in nature, with additional palpable tenderness along the anterior and lateral  aspect of the ribs.  History and exam is suggestive of costochondritis versus pleurisy.  Lab work-up has been reassuring.  Troponins are unremarkable.  EKG is reassuring.  Very low suspicion for ACS or myocarditis/pericarditis.  He does not have any significant comorbidities.  He is PERC negative.  Chest x-ray does not show any signs of infection.  We will provide him with a prescription for meloxicam and have him follow-up with his regular doctor.  Will discharge.  Considered admission for this patient, but given his stable presentation and unremarkable work-up, he is unlikely to benefit from admission.  Provided the patient with anticipatory guidance, return precautions, and educational material. Encouraged the patient to return to the emergency department at any time if they begin to experience any new or worsening symptoms. Patient expressed understanding and agreed with the plan.   Patient's presentation is most consistent with acute complicated illness / injury requiring diagnostic workup.       FINAL CLINICAL IMPRESSION(S) / ED DIAGNOSES   Final diagnoses:  Nonspecific chest pain     Rx / DC Orders   ED Discharge Orders          Ordered    meloxicam (MOBIC) 15 MG tablet  Daily        09/07/21 2101             Note:  This document was prepared using Dragon voice recognition software and may include unintentional dictation errors.   Teodoro Spray, [place] 09/07/21 2104    Nena Polio, MD 09/07/21 [phone]

## 2021-09-07 NOTE — Discharge Instructions (Addendum)
-  Take the meloxicam as needed for pain/discomfort.  You may additionally take acetaminophen.  -Please follow-up with your primary care provider within the next 1 to 2 weeks.  -Return to the emergency department anytime if you begin to experience any new or worsening symptoms.

## 2021-09-07 NOTE — ED Triage Notes (Signed)
PT coming from home POV with CP in L Chest and Bilateral lower ribs. PT stating its been happening for a few weeks and now its radiating down their arm. Pt L arm feels numb. Pt stating the last 12-14 hours the pain has been constant.  Pt denies Cardiac history, but does has asthma- Pt used their rescue inhaler with no relief.

## 2022-02-26 ENCOUNTER — Ambulatory Visit
Admission: RE | Admit: 2022-02-26 | Discharge: 2022-02-26 | Disposition: A | Discharge status: Home / Self Care | Payer: BLUE CROSS/BLUE SHIELD | Source: Ambulatory Visit | Attending: Urgent Care | Admitting: Urgent Care

## 2022-02-26 VITALS — BP 146/99 | HR 97 | Temp 97.8°F | Resp 16

## 2022-02-26 DIAGNOSIS — N4889 Other specified disorders of penis: Secondary | ICD-10-CM | POA: Diagnosis present

## 2022-02-26 NOTE — Discharge Instructions (Signed)
Follow up here or with your primary care provider if your symptoms are worsening or not improving.     

## 2022-02-26 NOTE — ED Triage Notes (Signed)
Patient presents to [facility] for STD testing. He states 2 days ago he developed tingling in penis and intermittent shooting pain. Concerned with herpes.   Denies urinary symptoms.
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
## 2022-03-01 LAB — CYTOLOGY, (ORAL, ANAL, URETHRAL) ANCILLARY ONLY
Chlamydia: NEGATIVE
Comment: NEGATIVE
Comment: NEGATIVE
Comment: NORMAL
Neisseria Gonorrhea: NEGATIVE
Trichomonas: NEGATIVE

## 2022-03-02 LAB — HSV CULTURE AND TYPING
